# Patient Record
Sex: Male | Born: 1960 | Race: White | Hispanic: No | Marital: Single | Smoking: Former smoker
Health system: Southern US, Community
[De-identification: ages and names within clinical notes are randomized; demographics above are authoritative.]

## PROBLEM LIST (undated history)

## (undated) DIAGNOSIS — K219 Gastro-esophageal reflux disease without esophagitis: Secondary | ICD-10-CM

## (undated) DIAGNOSIS — Q161 Congenital absence, atresia and stricture of auditory canal (external): Secondary | ICD-10-CM

## (undated) DIAGNOSIS — Z950 Presence of cardiac pacemaker: Secondary | ICD-10-CM

## (undated) DIAGNOSIS — E43 Unspecified severe protein-calorie malnutrition: Secondary | ICD-10-CM

## (undated) DIAGNOSIS — G8250 Quadriplegia, unspecified: Secondary | ICD-10-CM

## (undated) DIAGNOSIS — F339 Major depressive disorder, recurrent, unspecified: Secondary | ICD-10-CM

## (undated) HISTORY — PX: SUPRAPUBIC CATHETER INSERTION: SUR719

## (undated) HISTORY — PX: PEG TUBE PLACEMENT: SUR1034

---

## 2017-01-18 DIAGNOSIS — S14109A Unspecified injury at unspecified level of cervical spinal cord, initial encounter: Secondary | ICD-10-CM | POA: Insufficient documentation

## 2017-01-19 DIAGNOSIS — S301XXA Contusion of abdominal wall, initial encounter: Secondary | ICD-10-CM | POA: Insufficient documentation

## 2017-01-19 DIAGNOSIS — S2241XA Multiple fractures of ribs, right side, initial encounter for closed fracture: Secondary | ICD-10-CM | POA: Insufficient documentation

## 2017-01-20 DIAGNOSIS — R627 Adult failure to thrive: Secondary | ICD-10-CM | POA: Insufficient documentation

## 2017-01-20 DIAGNOSIS — T794XXA Traumatic shock, initial encounter: Secondary | ICD-10-CM | POA: Insufficient documentation

## 2017-01-20 DIAGNOSIS — F438 Other reactions to severe stress: Secondary | ICD-10-CM | POA: Insufficient documentation

## 2017-02-19 DIAGNOSIS — E43 Unspecified severe protein-calorie malnutrition: Secondary | ICD-10-CM | POA: Insufficient documentation

## 2017-05-10 DIAGNOSIS — L89104 Pressure ulcer of unspecified part of back, stage 4: Secondary | ICD-10-CM | POA: Insufficient documentation

## 2017-07-03 ENCOUNTER — Encounter: Payer: Self-pay | Admitting: Internal Medicine

## 2017-07-03 ENCOUNTER — Emergency Department: Payer: Medicaid Other

## 2017-07-03 ENCOUNTER — Inpatient Hospital Stay
Admission: EM | Admit: 2017-07-03 | Discharge: 2017-07-08 | DRG: 871 | Disposition: A | Discharge status: Home / Self Care | Payer: Medicaid Other | Attending: Internal Medicine | Admitting: Internal Medicine

## 2017-07-03 DIAGNOSIS — D638 Anemia in other chronic diseases classified elsewhere: Secondary | ICD-10-CM | POA: Diagnosis present

## 2017-07-03 DIAGNOSIS — Z9359 Other cystostomy status: Secondary | ICD-10-CM

## 2017-07-03 DIAGNOSIS — Z87891 Personal history of nicotine dependence: Secondary | ICD-10-CM | POA: Diagnosis not present

## 2017-07-03 DIAGNOSIS — Z931 Gastrostomy status: Secondary | ICD-10-CM | POA: Diagnosis not present

## 2017-07-03 DIAGNOSIS — L8990 Pressure ulcer of unspecified site, unspecified stage: Secondary | ICD-10-CM | POA: Insufficient documentation

## 2017-07-03 DIAGNOSIS — Z66 Do not resuscitate: Secondary | ICD-10-CM | POA: Diagnosis present

## 2017-07-03 DIAGNOSIS — E43 Unspecified severe protein-calorie malnutrition: Secondary | ICD-10-CM | POA: Diagnosis present

## 2017-07-03 DIAGNOSIS — A419 Sepsis, unspecified organism: Principal | ICD-10-CM

## 2017-07-03 DIAGNOSIS — N319 Neuromuscular dysfunction of bladder, unspecified: Secondary | ICD-10-CM | POA: Diagnosis present

## 2017-07-03 DIAGNOSIS — R0902 Hypoxemia: Secondary | ICD-10-CM | POA: Diagnosis present

## 2017-07-03 DIAGNOSIS — Y95 Nosocomial condition: Secondary | ICD-10-CM | POA: Diagnosis present

## 2017-07-03 DIAGNOSIS — L89154 Pressure ulcer of sacral region, stage 4: Secondary | ICD-10-CM | POA: Diagnosis present

## 2017-07-03 DIAGNOSIS — Z880 Allergy status to penicillin: Secondary | ICD-10-CM

## 2017-07-03 DIAGNOSIS — R532 Functional quadriplegia: Secondary | ICD-10-CM | POA: Diagnosis present

## 2017-07-03 DIAGNOSIS — T83511A Infection and inflammatory reaction due to indwelling urethral catheter, initial encounter: Secondary | ICD-10-CM

## 2017-07-03 DIAGNOSIS — Z950 Presence of cardiac pacemaker: Secondary | ICD-10-CM | POA: Diagnosis not present

## 2017-07-03 DIAGNOSIS — K219 Gastro-esophageal reflux disease without esophagitis: Secondary | ICD-10-CM | POA: Diagnosis present

## 2017-07-03 DIAGNOSIS — Z79899 Other long term (current) drug therapy: Secondary | ICD-10-CM

## 2017-07-03 DIAGNOSIS — Z888 Allergy status to other drugs, medicaments and biological substances status: Secondary | ICD-10-CM | POA: Diagnosis not present

## 2017-07-03 DIAGNOSIS — J90 Pleural effusion, not elsewhere classified: Secondary | ICD-10-CM

## 2017-07-03 DIAGNOSIS — R001 Bradycardia, unspecified: Secondary | ICD-10-CM | POA: Diagnosis present

## 2017-07-03 DIAGNOSIS — J189 Pneumonia, unspecified organism: Secondary | ICD-10-CM

## 2017-07-03 DIAGNOSIS — N390 Urinary tract infection, site not specified: Secondary | ICD-10-CM

## 2017-07-03 DIAGNOSIS — Z8249 Family history of ischemic heart disease and other diseases of the circulatory system: Secondary | ICD-10-CM | POA: Diagnosis not present

## 2017-07-03 DIAGNOSIS — R319 Hematuria, unspecified: Secondary | ICD-10-CM

## 2017-07-03 HISTORY — DX: Unspecified severe protein-calorie malnutrition: E43

## 2017-07-03 HISTORY — DX: Presence of cardiac pacemaker: Z95.0

## 2017-07-03 HISTORY — DX: Congenital absence, atresia and stricture of auditory canal (external): Q16.1

## 2017-07-03 HISTORY — DX: Major depressive disorder, recurrent, unspecified: F33.9

## 2017-07-03 HISTORY — DX: Gastro-esophageal reflux disease without esophagitis: K21.9

## 2017-07-03 HISTORY — DX: Quadriplegia, unspecified: G82.50

## 2017-07-03 LAB — URINALYSIS, ROUTINE W REFLEX MICROSCOPIC
BACTERIA UA: NONE SEEN
Bilirubin Urine: NEGATIVE
Glucose, UA: NEGATIVE mg/dL
KETONES UR: NEGATIVE mg/dL
Nitrite: NEGATIVE
PROTEIN: 30 mg/dL — AB
Specific Gravity, Urine: 1.015 (ref 1.005–1.030)
pH: 5 (ref 5.0–8.0)

## 2017-07-03 LAB — COMPREHENSIVE METABOLIC PANEL
ALBUMIN: 1.6 g/dL — AB (ref 3.5–5.0)
ALK PHOS: 115 U/L (ref 38–126)
ALT: 21 U/L (ref 17–63)
AST: 35 U/L (ref 15–41)
Anion gap: 8 (ref 5–15)
BILIRUBIN TOTAL: 0.3 mg/dL (ref 0.3–1.2)
BUN: 58 mg/dL — AB (ref 6–20)
CO2: 29 mmol/L (ref 22–32)
Calcium: 9 mg/dL (ref 8.9–10.3)
Chloride: 96 mmol/L — ABNORMAL LOW (ref 101–111)
Creatinine, Ser: 0.69 mg/dL (ref 0.61–1.24)
GFR calc Af Amer: >60 mL/min (ref >60)
GFR calc non Af Amer: >60 mL/min (ref >60)
GLUCOSE: 127 mg/dL — AB (ref 65–99)
POTASSIUM: 3.9 mmol/L (ref 3.5–5.1)
Sodium: 133 mmol/L — ABNORMAL LOW (ref 135–145)
Total Protein: 6.3 g/dL — ABNORMAL LOW (ref 6.5–8.1)

## 2017-07-03 LAB — CBC WITH DIFFERENTIAL/PLATELET
BASOS ABS: 0.1 10*3/uL (ref 0–0.1)
Basophils Relative: 0 %
EOS PCT: 1 %
Eosinophils Absolute: 0.2 10*3/uL (ref 0–0.7)
HCT: 28.1 % — ABNORMAL LOW (ref 40.0–52.0)
Hemoglobin: 9 g/dL — ABNORMAL LOW (ref 13.0–18.0)
LYMPHS ABS: 0.5 10*3/uL — AB (ref 1.0–3.6)
LYMPHS PCT: 2 %
MCH: 26.7 pg (ref 26.0–34.0)
MCHC: 32 g/dL (ref 32.0–36.0)
MCV: 83.3 fL (ref 80.0–100.0)
MONO ABS: 1.4 10*3/uL — AB (ref 0.2–1.0)
MONOS PCT: 5 %
Neutro Abs: 25.1 10*3/uL — ABNORMAL HIGH (ref 1.4–6.5)
Neutrophils Relative %: 92 %
PLATELETS: 967 10*3/uL — AB (ref 150–440)
RBC: 3.38 MIL/uL — ABNORMAL LOW (ref 4.40–5.90)
RDW: 18.3 % — AB (ref 11.5–14.5)
WBC: 27.3 10*3/uL — ABNORMAL HIGH (ref 3.8–10.6)

## 2017-07-03 LAB — TROPONIN I

## 2017-07-03 LAB — MRSA PCR SCREENING: MRSA BY PCR: POSITIVE — AB

## 2017-07-03 LAB — LACTIC ACID, PLASMA
LACTIC ACID, VENOUS: 2 mmol/L — AB (ref 0.5–1.9)
Lactic Acid, Venous: 1.9 mmol/L (ref 0.5–1.9)

## 2017-07-03 LAB — APTT: aPTT: 44 seconds — ABNORMAL HIGH (ref 24–36)

## 2017-07-03 LAB — BRAIN NATRIURETIC PEPTIDE: B Natriuretic Peptide: 56 pg/mL (ref 0.0–100.0)

## 2017-07-03 LAB — PROTIME-INR
INR: 1.27
PROTHROMBIN TIME: 16 s — AB (ref 11.4–15.2)

## 2017-07-03 MED ORDER — SODIUM CHLORIDE 0.9 % IV BOLUS (SEPSIS)
1000.0000 mL | Freq: Once | INTRAVENOUS | Status: AC
  Administered 2017-07-03: 1000 mL via INTRAVENOUS

## 2017-07-03 MED ORDER — HYDROCORTISONE ACETATE 25 MG RE SUPP
25.0000 mg | Freq: Two times a day (BID) | RECTAL | Status: DC
  Filled 2017-07-03 (×9): qty 1

## 2017-07-03 MED ORDER — HYDROCODONE-ACETAMINOPHEN 5-325 MG PO TABS: 1.0000 | ORAL_TABLET | ORAL | Status: DC | PRN

## 2017-07-03 MED ORDER — MAGNESIUM HYDROXIDE 400 MG/5ML PO SUSP
30.0000 mL | Freq: Two times a day (BID) | ORAL | Status: DC | PRN
  Filled 2017-07-03: qty 30

## 2017-07-03 MED ORDER — ADULT MULTIVITAMIN LIQUID CH
15.0000 mL | Freq: Every day | ORAL | Status: DC
  Administered 2017-07-04 – 2017-07-08 (×5): 15 mL
  Filled 2017-07-03 (×5): qty 15

## 2017-07-03 MED ORDER — ACETAMINOPHEN 325 MG PO TABS: 650.0000 mg | ORAL_TABLET | Freq: Four times a day (QID) | ORAL | Status: DC | PRN

## 2017-07-03 MED ORDER — ALPRAZOLAM 0.5 MG PO TABS
0.5000 mg | ORAL_TABLET | Freq: Every day | ORAL | Status: DC
  Administered 2017-07-03 – 2017-07-08 (×5): 0.5 mg
  Filled 2017-07-03 (×5): qty 1

## 2017-07-03 MED ORDER — VANCOMYCIN HCL IN DEXTROSE 1-5 GM/200ML-% IV SOLN
1000.0000 mg | Freq: Once | INTRAVENOUS | Status: AC
  Administered 2017-07-03: 1000 mg via INTRAVENOUS
  Filled 2017-07-03: qty 200

## 2017-07-03 MED ORDER — PANTOPRAZOLE SODIUM 40 MG PO PACK
40.0000 mg | PACK | Freq: Every day | ORAL | Status: DC
  Administered 2017-07-04 – 2017-07-08 (×5): 40 mg
  Filled 2017-07-03 (×5): qty 20

## 2017-07-03 MED ORDER — VANCOMYCIN HCL IN DEXTROSE 1-5 GM/200ML-% IV SOLN
1000.0000 mg | Freq: Two times a day (BID) | INTRAVENOUS | Status: DC
  Administered 2017-07-04 – 2017-07-05 (×3): 1000 mg via INTRAVENOUS
  Filled 2017-07-03 (×4): qty 200

## 2017-07-03 MED ORDER — JEVITY 1.5 CAL/FIBER PO LIQD: 1000.0000 mL | Freq: Every day | ORAL | Status: DC

## 2017-07-03 MED ORDER — MUPIROCIN 2 % EX OINT
1.0000 "application " | TOPICAL_OINTMENT | Freq: Two times a day (BID) | CUTANEOUS | Status: AC
  Administered 2017-07-04 – 2017-07-08 (×10): 1 via NASAL
  Filled 2017-07-03: qty 22

## 2017-07-03 MED ORDER — JEVITY 1.5 CAL/FIBER PO LIQD
237.0000 mL | Freq: Every day | ORAL | Status: DC
  Administered 2017-07-03 – 2017-07-08 (×23): 237 mL

## 2017-07-03 MED ORDER — HYDROCODONE-ACETAMINOPHEN 5-325 MG PO TABS
1.0000 | ORAL_TABLET | ORAL | Status: DC | PRN
  Administered 2017-07-03 – 2017-07-06 (×13): 2
  Administered 2017-07-07: 11:00:00 1
  Administered 2017-07-07 – 2017-07-08 (×3): 2
  Filled 2017-07-03 (×11): qty 2
  Filled 2017-07-03: qty 1
  Filled 2017-07-03 (×5): qty 2

## 2017-07-03 MED ORDER — DEXTROSE 5 % IV SOLN
2.0000 g | Freq: Once | INTRAVENOUS | Status: AC
  Administered 2017-07-03: 2 g via INTRAVENOUS
  Filled 2017-07-03: qty 2

## 2017-07-03 MED ORDER — DOCUSATE SODIUM 100 MG PO CAPS: 100.0000 mg | ORAL_CAPSULE | Freq: Two times a day (BID) | ORAL | Status: DC | PRN

## 2017-07-03 MED ORDER — CHLORHEXIDINE GLUCONATE CLOTH 2 % EX PADS
6.0000 | MEDICATED_PAD | Freq: Every day | CUTANEOUS | Status: DC
  Administered 2017-07-04 – 2017-07-07 (×4): 6 via TOPICAL

## 2017-07-03 MED ORDER — FLUOXETINE HCL 20 MG PO TABS
20.0000 mg | ORAL_TABLET | Freq: Every day | ORAL | Status: DC
  Administered 2017-07-04 – 2017-07-08 (×5): 20 mg
  Filled 2017-07-03 (×5): qty 1

## 2017-07-03 MED ORDER — IPRATROPIUM-ALBUTEROL 0.5-2.5 (3) MG/3ML IN SOLN
3.0000 mL | Freq: Four times a day (QID) | RESPIRATORY_TRACT | Status: DC | PRN
  Administered 2017-07-06 – 2017-07-07 (×4): 3 mL via RESPIRATORY_TRACT
  Filled 2017-07-03 (×6): qty 3

## 2017-07-03 MED ORDER — IPRATROPIUM-ALBUTEROL 0.5-2.5 (3) MG/3ML IN SOLN
3.0000 mL | Freq: Once | RESPIRATORY_TRACT | Status: AC
  Administered 2017-07-03: 3 mL via RESPIRATORY_TRACT
  Filled 2017-07-03: qty 3

## 2017-07-03 MED ORDER — DEXTROSE 5 % IV SOLN
2.0000 g | Freq: Three times a day (TID) | INTRAVENOUS | Status: DC
  Administered 2017-07-04 – 2017-07-08 (×13): 2 g via INTRAVENOUS
  Filled 2017-07-03 (×15): qty 2

## 2017-07-03 MED ORDER — DIPHENHYDRAMINE HCL 25 MG PO TABS
25.0000 mg | ORAL_TABLET | Freq: Four times a day (QID) | ORAL | Status: DC | PRN
  Administered 2017-07-04 – 2017-07-07 (×12): 25 mg via ORAL
  Filled 2017-07-03 (×17): qty 1

## 2017-07-03 MED ORDER — LEVOFLOXACIN IN D5W 750 MG/150ML IV SOLN
750.0000 mg | Freq: Once | INTRAVENOUS | Status: AC
  Administered 2017-07-03: 750 mg via INTRAVENOUS
  Filled 2017-07-03: qty 150

## 2017-07-03 MED ORDER — DOXEPIN HCL 10 MG PO CAPS
10.0000 mg | ORAL_CAPSULE | Freq: Every day | ORAL | Status: DC
  Administered 2017-07-03 – 2017-07-08 (×5): 10 mg
  Filled 2017-07-03 (×6): qty 1

## 2017-07-03 MED ORDER — PRO-STAT SUGAR FREE PO LIQD
30.0000 mL | Freq: Three times a day (TID) | ORAL | Status: DC
Start: 2017-07-03 — End: 2017-07-07
  Administered 2017-07-04 – 2017-07-07 (×10): 30 mL via ORAL

## 2017-07-03 MED ORDER — SENNOSIDES 8.8 MG/5ML PO SYRP
10.0000 mL | ORAL_SOLUTION | Freq: Every day | ORAL | Status: DC
  Administered 2017-07-05: 10 mL
  Filled 2017-07-03 (×6): qty 10

## 2017-07-03 MED ORDER — ENOXAPARIN SODIUM 40 MG/0.4ML ~~LOC~~ SOLN: 40.0000 mg | SUBCUTANEOUS | Status: DC

## 2017-07-03 MED ORDER — MELATONIN 3 MG PO TABS
1.0000 | ORAL_TABLET | Freq: Every day | ORAL | Status: DC
Start: 2017-07-03 — End: 2017-07-04
  Filled 2017-07-03: qty 1

## 2017-07-03 MED ORDER — ENOXAPARIN SODIUM 40 MG/0.4ML ~~LOC~~ SOLN
40.0000 mg | SUBCUTANEOUS | Status: DC
  Administered 2017-07-04 – 2017-07-08 (×5): 40 mg via SUBCUTANEOUS
  Filled 2017-07-03 (×5): qty 0.4

## 2017-07-03 NOTE — Progress Notes (Signed)
Dr. Elisabeth PigeonVachhani notified patient normally takes Norco 2 tabs for pain at SNF. Verbal order read back and verified for Norco 1-2 tablets.
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
## 2017-07-03 NOTE — ED Provider Notes (Signed)
[facility] Emergency Department Provider Note  ____________________________________________  Time seen: Approximately 11:11 AM  I have reviewed the triage vital signs and the nursing notes.   HISTORY  Chief Complaint Shortness of Breath    HPI Alexander Escobar is a 56 y.o. male with a history of quadriplegia, indwelling Foley catheter, currently living in a nursing facility while under the treatment for pneumonia with Levaquin, presenting for shortness of breath and wheezing. The patient reports that this morning, he felt more short of breath and he has over the past few days. On arrival to the emergency department, the patient has oxygen saturations in the low 90s with tachypnea and accessory muscle use, with significant tachycardia or the patient is also concerned he may be dehydrated and does have dry mucous membranes. He is mentating well on arrival.   No past medical history on file.  There are no active problems to display for this patient.   No past surgical history on file.    Allergies Ciprofloxacin and Penicillins  No family history on file.  Social History Social History  Substance Use Topics  . Smoking status: Not on file  . Smokeless tobacco: Not on file  . Alcohol use Not on file    Review of Systems Constitutional: No fever/chills.No lightheadedness or syncope. Eyes: No visual changes. ENT: No sore throat. No congestion or rhinorrhea. Cardiovascular: Denies chest pain. Denies palpitations. Respiratory: Positive shortness of breath and wheezing.  Positive cough. Gastrointestinal: No abdominal pain.  No nausea, no vomiting.  No diarrhea.  No constipation. Genitourinary: Positive indwelling Foley with urine that has significant sediment. Musculoskeletal: "My back is messed up" Skin: Negative for rash. Neurological: Negative for headaches. No focal numbness, tingling or weakness.      ____________________________________________   PHYSICAL EXAM:  VITAL SIGNS: ED Triage Vitals  Enc Vitals Group     BP --      Pulse Rate 07/03/17 1108 (!) 115     Resp 07/03/17 1108 (!) 35     Temp 07/03/17 1108 98.4 F (36.9 C)     Temp Source 07/03/17 1108 Oral     SpO2 07/03/17 1108 92 %     Weight 07/03/17 1109 130 lb (59 kg)     Height 07/03/17 1109 5\' 9"  (1.753 m)     Head Circumference --      Peak Flow --      Pain Score 07/03/17 1106 0     Pain Loc --      Pain Edu? --      Excl. in GC? --     Constitutional: Alert and oriented. Chronically ill appearing and toxic. Answers questions appropriately.  Mentating normally. Eyes: Conjunctivae are normal.  EOMI. No scleral icterus. Head: Atraumatic. Nose: No congestion/rhinnorhea. Mouth/Throat: Mucous membranes are dry with diffuse poor dentition.  Neck: No stridor.  Supple.  No JVD. No meningismus. Cardiovascular: Fast rate, regular rhythm. No murmurs, rubs or gallops.  Respiratory: Positive tachypnea with accessory muscle use and retractions. The patient is oxygenating in the low 90s on my examination on supplemental oxygen. He is able to speak in 4-5 word sentences. He has rales diffusely bilaterally with some associated expiratory wheezing. Gastrointestinal: Soft, nontender and nondistended.  No guarding or rebound.  No peritoneal signs. GU: Doing Foley catheter with thick urine with lots of sediment.  Musculoskeletal: Contracted musculature diffusely. Neurologic:  A&Ox3.  Speech is clear.  Face and smile are symmetric.  EOMI.  paralyzed Skin:  Skin  is warm, dry. Decubitus evaluation deferred at this time until patient stabilized and can be rolled. Psychiatric: Mood and affect are normal. Speech and behavior are normal.  Normal judgement.  ____________________________________________   LABS (all labs ordered are listed, but only abnormal results are displayed)  Labs Reviewed  CULTURE, BLOOD (ROUTINE X 2)   CULTURE, BLOOD (ROUTINE X 2)  CULTURE, EXPECTORATED SPUTUM-ASSESSMENT  URINE CULTURE  COMPREHENSIVE METABOLIC PANEL  CBC WITH DIFFERENTIAL/PLATELET  URINALYSIS, ROUTINE W REFLEX MICROSCOPIC  LACTIC ACID, PLASMA  LACTIC ACID, PLASMA  BRAIN NATRIURETIC PEPTIDE  TROPONIN I  BLOOD GAS, VENOUS  APTT  PROTIME-INR   ____________________________________________  EKG  ED ECG REPORT I, Rockne Menghini, the attending physician, personally viewed and interpreted this ECG.   Date: 07/03/2017  EKG Time: 1109  Rate: 115  Rhythm: sinus tachycardia  Axis: leftward  Intervals:none  ST&T Change: No STEMI   ____________________________________________  RADIOLOGY  No results found.  ____________________________________________   PROCEDURES  Procedure(s) performed: None  Procedures  Critical Care performed: Yes, see critical care note(s) ____________________________________________   INITIAL IMPRESSION / ASSESSMENT AND PLAN / ED COURSE  Pertinent labs & imaging results that were available during my care of the patient were reviewed by me and considered in my medical decision making (see chart for details).  56 y.o. male with paralysis, indwelling Foley, ongoing treatment for pneumonia presenting with shortness of breath. On my evaluation, the patient has signs and symptoms consistent with sepsis. There are multiple sources including urine, pulmonary, and blood. A code sepsis has been called and the patient will be given immediate antibiotics. Plan admission to the hospital.  ----------------------------------------- 12:00 PM on 07/03/2017 -----------------------------------------  The patient does have a chest x-ray which shows increasing opacities in the bilateral lungs and pleural effusion; this is likely due to infection given his symptoms and white blood cell count of 27.3. Pulmonary edema or some superimposed pulmonary edema is not excluded. At this time, the  patient will be admitted to the hospitalist service.  CRITICAL CARE Performed by: Rockne Menghini   Total critical care time: 45 minutes  Critical care time was exclusive of separately billable procedures and treating other patients.  Critical care was necessary to treat or prevent imminent or life-threatening deterioration.  Critical care was time spent personally by me on the following activities: development of treatment plan with patient and/or surrogate as well as nursing, discussions with consultants, evaluation of patient's response to treatment, examination of patient, obtaining history from patient or surrogate, ordering and performing treatments and interventions, ordering and review of laboratory studies, ordering and review of radiographic studies, pulse oximetry and re-evaluation of patient's condition.   ____________________________________________  FINAL CLINICAL IMPRESSION(S) / ED DIAGNOSES  Final diagnoses:  Sepsis, due to unspecified organism [facility])  Urinary tract infection with hematuria, site unspecified  HCAP (healthcare-associated pneumonia)         NEW MEDICATIONS STARTED DURING THIS VISIT:  New Prescriptions   No medications on file      Rockne Menghini, MD 07/03/17 1201

## 2017-07-03 NOTE — ED Triage Notes (Signed)
[facility] Reports having difficulty breathing and cough for  Several days, worsening today. Patient on 4L [place] upon arrival to ED with O2 saturation of 94%. Patient states he does not wear oxygen at baseline. Patient also complaining of dark urine and "back is messed up". Patient A&O x4.

## 2017-07-03 NOTE — Progress Notes (Addendum)
Patient does bolus feedings 5 times daily, Jevity 1.5 and Prostat 3 times daily. Dr. Elisabeth PigeonVachhani notified.

## 2017-07-03 NOTE — Consult Note (Signed)
Pharmacy Antibiotic Note  Alexander CofferJames Escobar is a 56 y.o. male admitted on 07/03/2017 with pneumonia and UTI.  Pharmacy has been consulted for cefepime and vancomycin dosing. Patient failed outpatient treatment with levofloxacin. Pt has PCN allergy-however reaction is rash. Per Dr. Yvetta CoderVachhani-ok to try cefepime  Plan: Vancomycin 1g was given in the ED. Will give next dose is 6 hours for stacked dosing Vancomycin 1000mg  IV every 12 hours.  Goal trough 15-20 mcg/mL.  Vanc trough prior to the 5th total dose 7/19 @ 0630 Cefepime 2g q 8 hours  Height: 5\' 9"  (175.3 cm) Weight: 130 lb (59 kg) IBW/kg (Calculated) : 70.7  Temp (24hrs), Avg:98.4 F (36.9 C), Min:98.4 F (36.9 C), Max:98.4 F (36.9 C)   Recent Labs Lab 07/03/17 1113  WBC 27.3*  CREATININE 0.69  LATICACIDVEN 2.0*    Estimated Creatinine Clearance: 88.1 mL/min (by C-G formula based on SCr of 0.69 mg/dL).    Allergies  Allergen Reactions  . Ciprofloxacin Swelling  . Penicillins Rash    Antimicrobials this admission: levofloxacin 7/17 >> one dose aztreonam 7/17 >> one dose Vancomycin 7/17>> Cefepime 7/17>>  Dose adjustments this admission:   Microbiology results: 7/17 BCx:  7/17 UCx:   7/17 MRSA PCR:   Thank you for allowing pharmacy to be a part of this patient's care.  Olene FlossMelissa D Maribeth Jiles, Pharm.D, BCPS Clinical Pharmacist  07/03/2017 2:58 PM

## 2017-07-04 DIAGNOSIS — L8990 Pressure ulcer of unspecified site, unspecified stage: Secondary | ICD-10-CM | POA: Insufficient documentation

## 2017-07-04 LAB — BASIC METABOLIC PANEL
Anion gap: 8 (ref 5–15)
BUN: 47 mg/dL — ABNORMAL HIGH (ref 6–20)
CALCIUM: 8.4 mg/dL — AB (ref 8.9–10.3)
CO2: 26 mmol/L (ref 22–32)
CREATININE: 0.65 mg/dL (ref 0.61–1.24)
Chloride: 103 mmol/L (ref 101–111)
Glucose, Bld: 126 mg/dL — ABNORMAL HIGH (ref 65–99)
Potassium: 3.5 mmol/L (ref 3.5–5.1)
SODIUM: 137 mmol/L (ref 135–145)

## 2017-07-04 LAB — CBC
HCT: 22.6 % — ABNORMAL LOW (ref 40.0–52.0)
Hemoglobin: 7.2 g/dL — ABNORMAL LOW (ref 13.0–18.0)
MCH: 26.9 pg (ref 26.0–34.0)
MCHC: 32 g/dL (ref 32.0–36.0)
MCV: 83.9 fL (ref 80.0–100.0)
PLATELETS: 870 10*3/uL — AB (ref 150–440)
RBC: 2.69 MIL/uL — AB (ref 4.40–5.90)
RDW: 18.3 % — ABNORMAL HIGH (ref 11.5–14.5)
WBC: 35.1 10*3/uL — ABNORMAL HIGH (ref 3.8–10.6)

## 2017-07-04 LAB — EXPECTORATED SPUTUM ASSESSMENT W GRAM STAIN, RFLX TO RESP C: Special Requests: NORMAL

## 2017-07-04 LAB — HIV ANTIBODY (ROUTINE TESTING W REFLEX): HIV SCREEN 4TH GENERATION: NONREACTIVE

## 2017-07-04 LAB — EXPECTORATED SPUTUM ASSESSMENT W REFEX TO RESP CULTURE

## 2017-07-04 MED ORDER — GUAIFENESIN 100 MG/5ML PO SOLN
20.0000 mL | ORAL | Status: DC | PRN
  Administered 2017-07-04: 400 mg via ORAL
  Filled 2017-07-04 (×2): qty 20

## 2017-07-04 MED ORDER — ONDANSETRON 4 MG PO TBDP
4.0000 mg | ORAL_TABLET | Freq: Three times a day (TID) | ORAL | Status: DC | PRN
  Administered 2017-07-05 – 2017-07-08 (×6): 4 mg via ORAL
  Filled 2017-07-04 (×7): qty 1

## 2017-07-04 MED ORDER — LIDOCAINE 4 % EX PTCH: 3.0000 | MEDICATED_PATCH | Freq: Two times a day (BID) | CUTANEOUS | Status: DC

## 2017-07-04 MED ORDER — FUROSEMIDE 20 MG PO TABS
20.0000 mg | ORAL_TABLET | Freq: Every day | ORAL | Status: DC
  Administered 2017-07-04 – 2017-07-07 (×4): 20 mg via ORAL
  Filled 2017-07-04 (×5): qty 1

## 2017-07-04 MED ORDER — MELATONIN 5 MG PO TABS
5.0000 mg | ORAL_TABLET | Freq: Every day | ORAL | Status: DC
Start: 2017-07-04 — End: 2017-07-07
  Administered 2017-07-04 – 2017-07-06 (×4): 5 mg via ORAL
  Filled 2017-07-04 (×4): qty 1

## 2017-07-04 MED ORDER — FREE WATER
120.0000 mL | Freq: Every day | Status: DC
  Administered 2017-07-04 – 2017-07-08 (×19): 120 mL

## 2017-07-04 MED ORDER — DOCUSATE SODIUM 50 MG/5ML PO LIQD
200.0000 mg | Freq: Every day | ORAL | Status: DC
  Administered 2017-07-05: 200 mg
  Filled 2017-07-04 (×5): qty 20

## 2017-07-04 MED ORDER — MELATONIN 5 MG PO TABS
1.0000 | ORAL_TABLET | Freq: Every day | ORAL | Status: DC
  Filled 2017-07-04 (×4): qty 1

## 2017-07-04 MED ORDER — OSTEO BI-FLEX ADV JOINT SHIELD PO TABS: ORAL_TABLET | Freq: Every day | ORAL | Status: DC

## 2017-07-04 MED ORDER — BISACODYL 10 MG RE SUPP
10.0000 mg | Freq: Every day | RECTAL | Status: DC
  Filled 2017-07-04: qty 1

## 2017-07-04 MED ORDER — LIDOCAINE 5 % EX PTCH
1.0000 | MEDICATED_PATCH | CUTANEOUS | Status: DC
  Filled 2017-07-04 (×4): qty 1

## 2017-07-04 MED ORDER — FREE WATER
200.0000 mL | Freq: Four times a day (QID) | Status: DC
  Administered 2017-07-04 – 2017-07-07 (×14): 200 mL

## 2017-07-04 MED ORDER — POLYETHYLENE GLYCOL 3350 17 G PO PACK
17.0000 g | PACK | Freq: Every day | ORAL | Status: DC
  Administered 2017-07-05 – 2017-07-07 (×2): 17 g via ORAL
  Filled 2017-07-04 (×2): qty 1

## 2017-07-04 MED ORDER — MELATONIN 3 MG PO TABS: 1.0000 | ORAL_TABLET | Freq: Every day | ORAL | Status: DC

## 2017-07-04 NOTE — Progress Notes (Signed)
SLP Cancellation Note  Patient Details Name: Alexander Escobar MRN: [id number] DOB: 03/15/1961   Cancelled treatment:       Reason Eval/Treat Not Completed: Patient not medically ready (pt has a baseline of Dysphagia; PEG placement)  Reviewed chart notes; consulted family member re: pt's swallowing status. It was stated pt received a swallow study "before he left [facility]" and that he "could drink thin liquids". Unsure how he has done w/ that poc since admitting to the SNF in April. Pt does continue w/ PEG TFs per report.  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 Due to his declined medical and respiratory status', recommend continue no oral intake w/ PEG TFs; frequent oral care; f/u w/ MBSS tomorrow for objective assessment of swallowing. Family and NSG updated. MD agreed to order.   Jerilynn SomKatherine Watson, MS, CCC-SLP Watson,Katherine 07/04/2017, 5:15 PM
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
## 2017-07-04 NOTE — Progress Notes (Signed)
Please note patient is currently receiving Palliative services through [facility]. CSW Dede QuerySarah McNulty made aware. Thank you. Dayna BarkerKaren Robertson RN, BSN, [facility], [facility] Liaison [phone] c

## 2017-07-04 NOTE — Plan of Care (Signed)
Problem: Education: Goal: Knowledge of [facility] Education information/materials will improve Outcome: Progressing VS WDL, free of falls.  Reports wound pain 7/10, improved w/ Norco 10-650mg  per tube x1.  Wound dsgs changed during shift.  Pt transferred to specialty air mattress.  No other needs overnight.  Bed in low position, call bell within reach.  WCTM.

## 2017-07-04 NOTE — Consult Note (Signed)
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  Stage 4 sacral pressure injury Present on admission.  Right lateral back with hematoma evacuation site, full thickness injury. Wound type:Full thickness injury, stage 4 pressure injuries.  Pressure Injury POA: Yes Measurement: Sacrum:  8 cm x 6 cm x 4 cm, bone palpable Right scapula:  3 cm x 3.5 cm x 1.5 cm with undermining present circumferentially, extends 1 cm .  Pale pink nongranulating  Right lateral back 4 cm x 2.6 cm x 0.5 cm pale pink nongranulating Midthoracic spine:  2.4 cm x 2 cm x 1 cm bonepalpable  Wound bed: pale pink nongranulating Drainage (amount, consistency, odor) Moderate serosanguinous  Musty odor.  Periwound:intact Dressing procedure/placement/frequency:Patient is placed on mattress with low air loss feature for pressure redistribution. Caregiver brought in positioning wedge from SNF.  [facility] and caregiver states that the MD Only wants NS moist gauze to wounds.   Cleanse wounds to back (3 openings) with NS and pat gently dry.  Fill each wound with NS moist gauze or kerlix, gently fill  in undermining.  Cover with ABD pad and tape.  Change daily and PRN soilage.  DO not use silicone border foam dressings, per MD and caregiver.  Will not follow at this time.  Please re-consult if needed.  Maple HudsonKaren Odean Fester RN BSN CWON Pager [phone]

## 2017-07-05 ENCOUNTER — Inpatient Hospital Stay: Payer: Medicaid Other

## 2017-07-05 LAB — VANCOMYCIN, TROUGH: Vancomycin Tr: 37 ug/mL (ref 15–20)

## 2017-07-05 NOTE — Progress Notes (Signed)
Spoke with Dr. Tobi BastosPyreddy about pt suprapubic catheter leaking at entry site. Per MD, a urology consult will be placed and follow up with the next shift. Will continue to monitor.
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
## 2017-07-05 NOTE — Progress Notes (Signed)
[facility]

## 2017-07-05 NOTE — Consult Note (Signed)
Pharmacy Antibiotic Note  Alexander Escobar is a 56 y.o. male admitted on 07/03/2017 with pneumonia and UTI.  Pharmacy has been consulted for cefepime and vancomycin dosing. Patient failed outpatient treatment with levofloxacin.   Plan:  VT = 37 mcg/mL is supratherapeutic on vancomycin 1000 mg IV q12h. Will discontinue vancomycin doses for now. Patient is quadraplegic, making it more difficult to assess renal function based on SCr.   Will hold additional vancomycin doses and check a random level at 0500 tomorrow (~24 hours after last administered dose). Resume vancomycin dosing once level is <15-20 mcg/mL  Continue cefepime 2g IV q 8 hours  Height: 5\' 9"  (175.3 cm) Weight: 130 lb (59 kg) IBW/kg (Calculated) : 70.7  Temp (24hrs), Avg:98.3 F (36.8 C), Min:97.8 F (36.6 C), Max:98.8 F (37.1 C)   Recent Labs Lab 07/03/17 1113 07/03/17 1653 07/04/17 0419 07/05/17 1359  WBC 27.3*  --  35.1*  --   CREATININE 0.69  --  0.65  --   LATICACIDVEN 2.0* 1.9  --   --   VANCOTROUGH  --   --   --  37*    Estimated Creatinine Clearance: 88.1 mL/min (by C-G formula based on SCr of 0.65 mg/dL).    Allergies  Allergen Reactions  . Ciprofloxacin Swelling  . Penicillins Rash    Antimicrobials this admission: levofloxacin 7/17 >> one dose aztreonam 7/17 >> one dose Vancomycin 7/17>> Cefepime 7/17>>  Dose adjustments this admission:   Microbiology results: 7/17 BCx: No growth 2 days 7/17 UCx:  >100K Staph aureus 7/17 MRSA PCR: Positive  Thank you for allowing pharmacy to be a part of this patient's care.  Cindi CarbonMary M Kennis Buell, Pharm.D, BCPS Clinical Pharmacist 07/05/2017 3:03 PM
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
## 2017-07-06 LAB — URINE CULTURE

## 2017-07-06 LAB — CBC
HCT: 25 % — ABNORMAL LOW (ref 40.0–52.0)
Hemoglobin: 8 g/dL — ABNORMAL LOW (ref 13.0–18.0)
MCH: 27.1 pg (ref 26.0–34.0)
MCHC: 32 g/dL (ref 32.0–36.0)
MCV: 84.7 fL (ref 80.0–100.0)
PLATELETS: 1021 10*3/uL — AB (ref 150–440)
RBC: 2.95 MIL/uL — AB (ref 4.40–5.90)
RDW: 18.1 % — AB (ref 11.5–14.5)
WBC: 25 10*3/uL — AB (ref 3.8–10.6)

## 2017-07-06 LAB — CREATININE, SERUM: Creatinine, Ser: 0.52 mg/dL — ABNORMAL LOW (ref 0.61–1.24)

## 2017-07-06 LAB — VANCOMYCIN, RANDOM: Vancomycin Rm: 26

## 2017-07-06 NOTE — Progress Notes (Signed)
Speech Therapy Note: reviewed chart notes; MD has followed up w/ pt and family re: MBSS results and understanding of the increased risk for aspiration w/ any oral intake. MD agreed w/ f/u w/ palliative care services for ongoing support. Education has been provided and posted in room. No further skilled ST services indicated at this time. ST will sign off w/ NSG to reconsult if any change in status warranting ST services occurs while admitted. Recommend frequent oral care for hygiene and oral stimulation.     Jerilynn SomKatherine Watson, MS, CCC-SLP
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
## 2017-07-06 NOTE — Consult Note (Signed)
Pharmacy Antibiotic Note  Alexander CofferJames Escobar is a 56 y.o. male admitted on 07/03/2017 with pneumonia and UTI.  Pharmacy has been consulted for cefepime and vancomycin dosing. Patient failed outpatient treatment with levofloxacin.   Plan:  VT = 37 mcg/mL is supratherapeutic on vancomycin 1000 mg IV q12h. Will discontinue vancomycin doses for now. Patient is quadraplegic, making it more difficult to assess renal function based on SCr.   Will hold additional vancomycin doses and check a random level at 0500 tomorrow (~24 hours after last administered dose). Resume vancomycin dosing once level is <15-20 mcg/mL  7/20 @ 0451 VR 26 supratherapeutic. Will continue to hold off on doses of vanc for now Patient's Ke based on two levels: 0.0235 T1/2 30 hours Time to get down to a level of 15 mcg/mL: 24 hours Will draw another vanc random 7/21 @ 0500 and will redose w/ vanc 15 mg/kg if level < 20 mcg/mL.  Continue cefepime 2g IV q 8 hours  Height: 5\' 9"  (175.3 cm) Weight: 130 lb (59 kg) IBW/kg (Calculated) : 70.7  Temp (24hrs), Avg:98.2 F (36.8 C), Min:97.8 F (36.6 C), Max:98.5 F (36.9 C)   Recent Labs Lab 07/03/17 1113 07/03/17 1653 07/04/17 0419 07/05/17 1359 07/06/17 0451  WBC 27.3*  --  35.1*  --  25.0*  CREATININE 0.69  --  0.65  --  0.52*  LATICACIDVEN 2.0* 1.9  --   --   --   VANCOTROUGH  --   --   --  37*  --   VANCORANDOM  --   --   --   --  26    Estimated Creatinine Clearance: 88.1 mL/min (A) (by C-G formula based on SCr of 0.52 mg/dL (L)).    Allergies  Allergen Reactions  . Ciprofloxacin Swelling  . Penicillins Rash    Antimicrobials this admission: levofloxacin 7/17 >> one dose aztreonam 7/17 >> one dose Vancomycin 7/17>> Cefepime 7/17>>  Dose adjustments this admission:   Microbiology results: 7/17 BCx: No growth 2 days 7/17 UCx:  >100K Staph aureus 7/17 MRSA PCR: Positive  Thank you for allowing pharmacy to be a part of this patient's care.  Thomasene Rippleavid   Nazim Kadlec, Pharm.D, BCPS Clinical Pharmacist 07/06/2017 5:50 AM

## 2017-07-06 NOTE — Progress Notes (Signed)
Assisted pt with Quad cough. Pt tolerated well.

## 2017-07-06 NOTE — Progress Notes (Signed)
Reported high platelet of 1021 to Dr. Tobi BastosPyreddy. No new orders, will continue to monitor.

## 2017-07-06 NOTE — Progress Notes (Signed)
Patient and family is aware of the risks of aspiration from oral intake for comfort, but want comfort intake orally

## 2017-07-07 LAB — CBC
HEMATOCRIT: 26.5 % — AB (ref 40.0–52.0)
HEMOGLOBIN: 8.7 g/dL — AB (ref 13.0–18.0)
MCH: 28 pg (ref 26.0–34.0)
MCHC: 32.9 g/dL (ref 32.0–36.0)
MCV: 84.9 fL (ref 80.0–100.0)
Platelets: 1075 10*3/uL (ref 150–440)
RBC: 3.12 MIL/uL — AB (ref 4.40–5.90)
RDW: 18.3 % — ABNORMAL HIGH (ref 11.5–14.5)
WBC: 21 10*3/uL — ABNORMAL HIGH (ref 3.8–10.6)

## 2017-07-07 LAB — VANCOMYCIN, RANDOM: Vancomycin Rm: 16

## 2017-07-07 MED ORDER — BISACODYL 10 MG RE SUPP: 10.0000 mg | Freq: Every day | RECTAL | Status: DC | PRN

## 2017-07-07 MED ORDER — HYDROCORTISONE ACETATE 25 MG RE SUPP
25.0000 mg | Freq: Two times a day (BID) | RECTAL | Status: DC | PRN
  Filled 2017-07-07: qty 1

## 2017-07-07 MED ORDER — DIPHENHYDRAMINE HCL 25 MG PO TABS
25.0000 mg | ORAL_TABLET | Freq: Four times a day (QID) | ORAL | Status: DC | PRN
  Administered 2017-07-08 (×2): 25 mg via ORAL
  Filled 2017-07-07 (×3): qty 1

## 2017-07-07 MED ORDER — ORAL CARE MOUTH RINSE
15.0000 mL | Freq: Two times a day (BID) | OROMUCOSAL | Status: DC
  Administered 2017-07-07 – 2017-07-08 (×2): 15 mL via OROMUCOSAL

## 2017-07-07 MED ORDER — HYDROCORTISONE ACETATE 25 MG RE SUPP: 25.0000 mg | Freq: Two times a day (BID) | RECTAL | Status: DC

## 2017-07-07 MED ORDER — MELATONIN 5 MG PO TABS
1.0000 | ORAL_TABLET | Freq: Every day | ORAL | Status: DC
  Administered 2017-07-08: 5 mg
  Filled 2017-07-07 (×2): qty 1

## 2017-07-07 MED ORDER — PRO-STAT SUGAR FREE PO LIQD
30.0000 mL | Freq: Three times a day (TID) | ORAL | Status: DC
  Administered 2017-07-07 – 2017-07-08 (×3): 30 mL

## 2017-07-07 MED ORDER — SODIUM CHLORIDE 0.9% FLUSH
3.0000 mL | Freq: Two times a day (BID) | INTRAVENOUS | Status: DC
  Administered 2017-07-07 – 2017-07-08 (×2): 3 mL via INTRAVENOUS

## 2017-07-07 MED ORDER — VANCOMYCIN HCL IN DEXTROSE 750-5 MG/150ML-% IV SOLN
750.0000 mg | Freq: Once | INTRAVENOUS | Status: AC
  Administered 2017-07-07: 11:00:00 750 mg via INTRAVENOUS
  Filled 2017-07-07: qty 150

## 2017-07-07 MED ORDER — GUAIFENESIN 100 MG/5ML PO SOLN
20.0000 mL | ORAL | Status: DC | PRN
  Filled 2017-07-07: qty 20

## 2017-07-07 MED ORDER — FUROSEMIDE 20 MG PO TABS
20.0000 mg | ORAL_TABLET | Freq: Every day | ORAL | Status: DC
  Administered 2017-07-08: 20 mg
  Filled 2017-07-07: qty 1

## 2017-07-07 MED ORDER — LIDOCAINE 5 % EX PTCH
1.0000 | MEDICATED_PATCH | Freq: Two times a day (BID) | CUTANEOUS | Status: DC | PRN
  Filled 2017-07-07: qty 1

## 2017-07-07 MED ORDER — SODIUM CHLORIDE 0.9% FLUSH
3.0000 mL | INTRAVENOUS | Status: DC | PRN
  Administered 2017-07-07 (×2): 3 mL via INTRAVENOUS
  Filled 2017-07-07 (×2): qty 3

## 2017-07-07 NOTE — Progress Notes (Signed)
Pt requests multiple meds; awakes and desires meds. Continued lung congestion; sputum to lab with respiratory intervention. Reluctant and co's soreness in arms/shoulders with turning/repositioning. Does not like wedge. Pain, nausea and allergy meds given as prn per pt request. Meds adjusted per pt request. IV antibiotics continued. Wound consult submitted.

## 2017-07-07 NOTE — Progress Notes (Signed)
DNR, allergy bracelet and yellow bracelet on left arm.

## 2017-07-07 NOTE — Progress Notes (Signed)
Quad cough assist following CPT and nebulizer treatment. Pt tolerated well

## 2017-07-07 NOTE — Clinical Social Work Note (Signed)
Clinical Social Work Assessment  Patient Details  Name: Alexander CofferJames Eisenhuth MRN: [id number] Date of Birth: 05/03/1961  Date of referral:  07/07/17               Reason for consult:  Facility Placement                Permission sought to share information with:  Facility Industrial/product designerContact Representative Permission granted to share information::  Yes, Verbal Permission Granted  Name::        Agency::     Relationship::     Contact Information:     Housing/Transportation Living arrangements for the past 2 months:  Skilled Nursing Facility Source of Information:  Facility, Medical Team Patient Interpreter Needed:  None Criminal Activity/Legal Involvement Pertinent to Current Situation/Hospitalization:  No - Comment as needed Significant Relationships:  Merchandiser, retailCommunity Support Lives with:  Facility Resident Do you feel safe going back to the place where you live?  Yes Need for family participation in patient care:  No (Coment)  Care giving concerns:  [facility]  Social Worker assessment / plan:  CSW contacted the facility to discuss discharge planning. The patient is a LTC resident at this facility, and he can return when stable via non-emergent EMS. The patient has a pressure wound that is being conservatively dressed. According to the attending MD, if he is stable, he will discharge tomorrow. The facility is aware.  Employment status:  Retired Health and safety inspectornsurance information:  [place] Recommendations:  Not assessed at this time Information / Referral to community resources:     Patient/Family's Response to care:  The patient and facility thanked CSW for assistance.  Patient/Family's Understanding of and Emotional Response to Diagnosis, Current Treatment, and Prognosis:  The patient understands that he will benefit from continued LTC.  Emotional Assessment Appearance:  Appears stated age Attitude/Demeanor/Rapport:   (Lethargic) Affect (typically observed):   Appropriate Orientation:  Oriented to Self, Oriented to Place, Oriented to  Time, Oriented to Situation Alcohol / Substance use:  Never Used Psych involvement (Current and /or in the community):  No (Comment)  Discharge Needs  Concerns to be addressed:  Care Coordination, Discharge Planning Concerns Readmission within the last 30 days:  Yes Current discharge risk:  Chronically ill Barriers to Discharge:  Continued Medical Work up   UAL CorporationKaren M Millie Shorb, LCSW 07/07/2017, 3:43 PM
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
## 2017-07-07 NOTE — Plan of Care (Signed)
Problem: Health Behavior/Discharge Planning: Goal: Ability to manage health-related needs will improve Outcome: Not Met (add Reason) Pt quadaplegic; unable to manage care.  Problem: Skin Integrity: Goal: Risk for impaired skin integrity will decrease Outcome: Not Progressing Wound consult submitted due to extensive pressure areas. Pt reluctant and refuses turns off back.  Problem: Physical Regulation: Goal: Signs and symptoms of infection will decrease Outcome: Progressing Sputum collected by respiratory. Remains congested with weak cough insufficient to expectorate secretions. WBC trending down. Afebrile

## 2017-07-07 NOTE — Progress Notes (Signed)
Ok per MD Pyreddy to irrigate Suprapubic catheter.

## 2017-07-07 NOTE — Progress Notes (Signed)
Irrigated suprapubic catheter, pt tolerated well.

## 2017-07-07 NOTE — Consult Note (Signed)
Pharmacy Antibiotic Note  Alexander CofferJames Baar is a 56 y.o. male admitted on 07/03/2017 with pneumonia and UTI.  Pharmacy has been consulted for cefepime and vancomycin dosing. Patient failed outpatient treatment with levofloxacin.   Plan: Vancomycin 750mg  IV x 1 today Vancomycin level 16 today, > 48h after last dose. Renal function difficult to estimate due to paraplegia but appears stable, vancomycin clearance somewhat varied. Based on calculated PK parameters will give 750mg  x 1 today and recheck level at 36 hours.  Target trough 15-1820mcg/ml  Continue cefepime 2g IV q 8 hours  Height: 5\' 9"  (175.3 cm) Weight: 130 lb (59 kg) IBW/kg (Calculated) : 70.7  Temp (24hrs), Avg:98.4 F (36.9 C), Min:98 F (36.7 C), Max:98.7 F (37.1 C)   Recent Labs Lab 07/03/17 1113 07/03/17 1653 07/04/17 0419 07/05/17 1359 07/06/17 0451 07/07/17 0621  WBC 27.3*  --  35.1*  --  25.0* 21.0*  CREATININE 0.69  --  0.65  --  0.52*  --   LATICACIDVEN 2.0* 1.9  --   --   --   --   VANCOTROUGH  --   --   --  37*  --   --   VANCORANDOM  --   --   --   --  26 16    Estimated Creatinine Clearance: 88.1 mL/min (A) (by C-G formula based on SCr of 0.52 mg/dL (L)).    Allergies  Allergen Reactions  . Ciprofloxacin Swelling  . Penicillins Rash    Antimicrobials this admission: levofloxacin 7/17 >> one dose aztreonam 7/17 >> one dose Vancomycin 7/17>> Cefepime 7/17>>  Dose adjustments this admission:   Microbiology results: 7/17 BCx: No growth 2 days 7/17 UCx:  >100K MRSA 7/17 MRSA PCR: Positive  Thank you for allowing pharmacy to be a part of this patient's care.  Kamie Korber C, Pharm.D, BCPS Clinical Pharmacist 07/07/2017 9:11 AM

## 2017-07-07 NOTE — NC FL2 (Addendum)
[place] MEDICAID FL2 LEVEL OF CARE SCREENING TOOL     IDENTIFICATION  Patient Name: Alexander Escobar Birthdate: 1961/04/10 Sex: male Admission Date (Current Location): 07/03/2017  [facility] and [place] Number:  Chiropodist and Address:  [facility], [address] [place], [place] [address]      Provider Number: [id number]  Attending Physician Name and Address:  Enedina Finner, MD  Relative Name and Phone Number:       Current Level of Care: Hospital Recommended Level of Care: Skilled Nursing Facility Prior Approval Number:    Date Approved/Denied:   PASRR Number:    Discharge Plan: SNF    Current Diagnoses: Patient Active Problem List   Diagnosis Date Noted  . Pressure injury of skin 07/04/2017  . Sepsis (HCC) 07/03/2017  . UTI (urinary tract infection) due to urinary indwelling Foley catheter (HCC) 07/03/2017  . Pneumonia 07/03/2017    Orientation RESPIRATION BLADDER Height & Weight     Self, Time, Situation  O2 (2L o2) Indwelling catheter (Suprapubic Catheter) Weight: 130 lb (59 kg) Height:  5\' 9"  (175.3 cm)  BEHAVIORAL SYMPTOMS/MOOD NEUROLOGICAL BOWEL NUTRITION STATUS      Continent    AMBULATORY STATUS COMMUNICATION OF NEEDS Skin   Total Care Verbally PU Stage and Appropriate Care PU Stage 1 Dressing: BID                     Personal Care Assistance Level of Assistance  Bathing, Feeding, Dressing Bathing Assistance: Maximum assistance Feeding assistance: Independent Dressing Assistance: Maximum assistance     Functional Limitations Info             SPECIAL CARE FACTORS FREQUENCY                       Contractures Contractures Info: Present    Additional Factors Info  Code Status, Allergies Code Status Info: DNR Allergies Info: Ciprofloxacin, Penicillins           Current Medications (07/07/2017):  This is the current hospital active medication list Current Facility-Administered Medications   Medication Dose Route Frequency Provider Last Rate Last Dose  . acetaminophen (TYLENOL) tablet 650 mg  650 mg Per Tube Q6H PRN Altamese Dilling, MD      . ALPRAZolam Prudy Feeler) tablet 0.5 mg  0.5 mg Per Tube QHS Altamese Dilling, MD   0.5 mg at 07/06/17 2255  . bisacodyl (DULCOLAX) suppository 10 mg  10 mg Rectal Daily PRN Enedina Finner, MD      . ceFEPIme (MAXIPIME) 2 g in dextrose 5 % 50 mL IVPB  2 g Intravenous Q8H Malena Peer D, RPH   Stopped at 07/07/17 1035  . Chlorhexidine Gluconate Cloth 2 % PADS 6 each  6 each Topical Q0600 Altamese Dilling, MD   6 each at 07/07/17 0503  . diphenhydrAMINE (BENADRYL) tablet 25 mg  25 mg Oral Q6H PRN Enedina Finner, MD      . docusate (COLACE) 50 MG/5ML liquid 200 mg  200 mg Per Tube QHS Enedina Finner, MD   200 mg at 07/05/17 2119  . docusate sodium (COLACE) capsule 100 mg  100 mg Oral BID PRN Altamese Dilling, MD      . doxepin (SINEQUAN) capsule 10 mg  10 mg Per Tube QHS Altamese Dilling, MD   10 mg at 07/06/17 2300  . enoxaparin (LOVENOX) injection 40 mg  40 mg Subcutaneous Q24H Altamese Dilling, MD   40 mg at 07/07/17 1037  .  feeding supplement (JEVITY 1.5 CAL/FIBER) liquid 237 mL  237 mL Per Tube 5 X Daily Altamese DillingVachhani, Vaibhavkumar, MD   237 mL at 07/07/17 1425  . feeding supplement (PRO-STAT SUGAR FREE 64) liquid 30 mL  30 mL Per Tube TID Enedina FinnerPatel, Sona, MD      . FLUoxetine (PROZAC) tablet 20 mg  20 mg Per Tube Daily Altamese DillingVachhani, Vaibhavkumar, MD   20 mg at 07/07/17 1052  . free water 120 mL  120 mL Per Tube 5 X Daily Enedina FinnerPatel, Sona, MD   120 mL at 07/07/17 1425  . free water 200 mL  200 mL Per Tube QID Enedina FinnerPatel, Sona, MD   200 mL at 07/07/17 1226  . [START ON 07/08/2017] furosemide (LASIX) tablet 20 mg  20 mg Per Tube Daily Enedina FinnerPatel, Sona, MD      . guaiFENesin (ROBITUSSIN) 100 MG/5ML solution 400 mg  20 mL Per Tube Q4H PRN Enedina FinnerPatel, Sona, MD      . HYDROcodone-acetaminophen (NORCO/VICODIN) 5-325 MG per tablet 1-2 tablet  1-2 tablet Per  Tube Q4H PRN Altamese DillingVachhani, Vaibhavkumar, MD   1 tablet at 07/07/17 1052  . hydrocortisone (ANUSOL-HC) suppository 25 mg  25 mg Rectal BID PRN Enedina FinnerPatel, Sona, MD      . ipratropium-albuterol (DUONEB) 0.5-2.5 (3) MG/3ML nebulizer solution 3 mL  3 mL Nebulization Q6H PRN Altamese DillingVachhani, Vaibhavkumar, MD   3 mL at 07/07/17 1121  . lidocaine (LIDODERM) 5 % 1 patch  1 patch Transdermal Q12H PRN Enedina FinnerPatel, Sona, MD      . magnesium hydroxide (MILK OF MAGNESIA) suspension 30 mL  30 mL Per Tube BID PRN Altamese DillingVachhani, Vaibhavkumar, MD      . MEDLINE mouth rinse  15 mL Mouth Rinse q12n4p Enedina FinnerPatel, Sona, MD   15 mL at 07/07/17 1433  . Melatonin TABS 5 mg  1 tablet Per Tube QHS Enedina FinnerPatel, Sona, MD      . multivitamin liquid 15 mL  15 mL Per Tube Daily Altamese DillingVachhani, Vaibhavkumar, MD   15 mL at 07/07/17 1052  . mupirocin ointment (BACTROBAN) 2 % 1 application  1 application Nasal BID Altamese DillingVachhani, Vaibhavkumar, MD   1 application at 07/07/17 1042  . ondansetron (ZOFRAN-ODT) disintegrating tablet 4 mg  4 mg Oral Q8H PRN Enedina FinnerPatel, Sona, MD   4 mg at 07/07/17 1245  . pantoprazole sodium (PROTONIX) 40 mg/20 mL oral suspension 40 mg  40 mg Per Tube QAC breakfast Altamese DillingVachhani, Vaibhavkumar, MD   40 mg at 07/07/17 0528  . polyethylene glycol (MIRALAX / GLYCOLAX) packet 17 g  17 g Oral Daily Enedina FinnerPatel, Sona, MD   17 g at 07/07/17 1045  . sennosides (SENOKOT) 8.8 MG/5ML syrup 10 mL  10 mL Per Tube QHS Altamese DillingVachhani, Vaibhavkumar, MD   10 mL at 07/05/17 2119  . sodium chloride flush (NS) 0.9 % injection 3 mL  3 mL Intravenous Q12H Enedina FinnerPatel, Sona, MD   3 mL at 07/07/17 1053  . sodium chloride flush (NS) 0.9 % injection 3 mL  3 mL Intravenous PRN Enedina FinnerPatel, Sona, MD   3 mL at 07/07/17 1224     Discharge Medications: Current Discharge Medication List       START taking these medications   Details  Amino Acids-Protein Hydrolys (FEEDING SUPPLEMENT, PRO-STAT SUGAR FREE 64,) LIQD Place 30 mLs into feeding tube 3 (three) times daily. Qty: 900 mL, Refills: 0    cefUROXime  (CEFTIN) 250 MG/5ML suspension Take 10 mLs (500 mg total) by mouth 2 (two) times daily. Qty: 100 mL, Refills:  0    Nutritional Supplements (FEEDING SUPPLEMENT, JEVITY 1.5 CAL/FIBER,) LIQD Place 237 mLs into feeding tube 5 (five) times daily. Qty: 300 mL, Refills: 0         CONTINUE these medications which have NOT CHANGED   Details  acetaminophen (TYLENOL) 325 MG tablet Take 650 mg by mouth every 6 (six) hours as needed.    ALPRAZolam (XANAX) 0.5 MG tablet Take 0.5 mg by mouth at bedtime.    bisacodyl (DULCOLAX) 10 MG suppository Place 10 mg rectally daily.    chlorpheniramine (CHLOR-TRIMETON) 4 MG tablet Take 4 mg by mouth 4 (four) times daily.    docusate (COLACE) 50 MG/5ML liquid Place 200 mg into feeding tube at bedtime.    doxepin (SINEQUAN) 10 MG capsule Take 10 mg by mouth at bedtime.    enoxaparin (LOVENOX) 40 MG/0.4ML injection Inject 40 mg into the skin daily.    FLUoxetine (PROZAC) 20 MG tablet Take 20 mg by mouth daily.    furosemide (LASIX) 20 MG tablet Take 20 mg by mouth daily.    guaiFENesin (ROBITUSSIN) 100 MG/5ML SOLN Take 20 mLs by mouth every 4 (four) hours as needed for cough or to loosen phlegm.    HYDROcodone-acetaminophen (NORCO/VICODIN) 5-325 MG tablet Take 1 tablet by mouth every 4 (four) hours as needed for moderate pain.    hydrocortisone (ANUSOL-HC) 25 MG suppository Place 25 mg rectally 2 (two) times daily.    ipratropium-albuterol (DUONEB) 0.5-2.5 (3) MG/3ML SOLN Take 3 mLs by nebulization every 6 (six) hours as needed.    Lidocaine 4 % PTCH Apply 3 patches topically 2 (two) times daily.    magnesium hydroxide (MILK OF MAGNESIA) 400 MG/5ML suspension Take 30 mLs by mouth 2 (two) times daily as needed for mild constipation.    Melatonin 3 MG TABS Take 1 tablet by mouth at bedtime.    metroNIDAZOLE (FLAGYL) 500 MG tablet Take 500 mg by mouth daily.    Misc Natural Products (OSTEO BI-FLEX ADV JOINT SHIELD PO) Take 1 tablet  by mouth daily.    Multiple Vitamin (MULTIVITAMIN) LIQD Take 15 mLs by mouth daily.    ondansetron (ZOFRAN-ODT) 4 MG disintegrating tablet Take 4 mg by mouth every 8 (eight) hours as needed for nausea or vomiting.    pantoprazole sodium (PROTONIX) 40 mg/20 mL PACK Place 40 mg into feeding tube daily.    polyethylene glycol (MIRALAX / GLYCOLAX) packet Take 17 g by mouth daily.    sennosides (SENOKOT) 8.8 MG/5ML syrup Place 10 mLs into feeding tube at bedtime.        Relevant Imaging Results:  Relevant Lab Results:   Additional Information SS# [id number]  Judi Cong, LCSW

## 2017-07-07 NOTE — Consult Note (Signed)
WOC Nurse wound consult note Reason for Consult: Reconsultation order noted.  Pressure injuries, POA.  Chronic, non-healing. Patient seen by my partner, K. Sanders, on 07/04/17 (2 days ago) for measurement and assessment of wounds, establishment of care plan. Patient is followed by [facility] in [place] and wound care orders are per that physician as well as patient and family request.  Conservative care orders  (NS cleanse, fluff-pack wounds with saline moistened gauze, cover and secure with paper tape) are in place.  I have added an additional scheduled dressing change to the once daily dressings and they are now twice daily to keep continually moist.  Wound type: Pressure Pressure Injury POA: Yes Dr. Allena KatzPatel notes today that if stable, patient may return to facility tomorrow.  WOC nursing team will not follow, but will remain available to this patient, the nursing and medical teams.  Please re-consult if needed. Thanks, Ladona MowLaurie Meera Vasco, MSN, RN, GNP, Hans EdenCWOCN, CWON-AP, FAAN  Pager# [phone]

## 2017-07-07 NOTE — Clinical Social Work Note (Addendum)
CSW is aware that patient is from [facility]. CSW will assess when able.  Argentina PonderKaren Martha Tameia Rafferty, MSW, Theresia MajorsLCSWA [phone]

## 2017-07-07 NOTE — Progress Notes (Signed)
CPT followed by 5-10 min of quad coughing

## 2017-07-08 LAB — CULTURE, BLOOD (ROUTINE X 2)
CULTURE: NO GROWTH
Culture: NO GROWTH
Special Requests: ADEQUATE

## 2017-07-08 LAB — CREATININE, SERUM
Creatinine, Ser: 0.7 mg/dL (ref 0.61–1.24)
GFR calc non Af Amer: >60 mL/min (ref >60)

## 2017-07-08 MED ORDER — CEFUROXIME AXETIL 250 MG/5ML PO SUSR: 500.0000 mg | Freq: Two times a day (BID) | ORAL | 0 refills | Status: AC

## 2017-07-08 MED ORDER — PRO-STAT SUGAR FREE PO LIQD: 30.0000 mL | Freq: Three times a day (TID) | ORAL | 0 refills | Status: AC

## 2017-07-08 MED ORDER — CEFUROXIME AXETIL 250 MG/5ML PO SUSR
500.0000 mg | Freq: Two times a day (BID) | ORAL | Status: DC
  Filled 2017-07-08 (×2): qty 10

## 2017-07-08 MED ORDER — JEVITY 1.5 CAL/FIBER PO LIQD: 237.0000 mL | Freq: Every day | ORAL | 0 refills | Status: DC

## 2017-07-08 NOTE — Clinical Social Work Note (Addendum)
Patient will discharge to [facility] via non-emergent EMS. The patient and the facility are aware. All documentation has been forwarded to the facility.   CSW contacted the patient's contact Elnita Maxwellheryl per his request. Elnita MaxwellCheryl asked the CSW about medical information concerning discharge. The CSW alerted Elnita MaxwellCheryl that the information would be best from his attending RN. CSW gave the contact the unit phone number. CSW will continue to follow pending additional discharge needs.   Argentina PonderKaren Martha Yamilet Mcfayden, MSW, Theresia MajorsLCSWA [phone]

## 2017-07-08 NOTE — Progress Notes (Signed)
TC to Cooperheryl and updated that EMS here to transport pt to [facility]. Discharge packet sent with EMS. Discharge to [facility] -SNF.
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
## 2017-07-08 NOTE — Plan of Care (Signed)
Problem: Skin Integrity: Goal: Risk for impaired skin integrity will decrease Outcome: Not Progressing Patient refuses to turn due to contractures and pain. Dressing changed to coccyx per order. Pt refused dressing changes to back.

## 2017-07-08 NOTE — Progress Notes (Signed)
TC report to Christiane HaJonathan at [facility] with pt accepted. AM care completed with decubitus dressings changed. Discharge packet ready with AVS printed. Pt has red allergy bracelet, purple DNR bracelet on.  Pt agreeable for discharge. Family is aware and ask for copy of AVS which I have printed ready for them.

## 2017-07-09 LAB — BLOOD GAS, VENOUS
Acid-Base Excess: 6.4 mmol/L — ABNORMAL HIGH (ref 0.0–2.0)
Bicarbonate: 32.2 mmol/L — ABNORMAL HIGH (ref 20.0–28.0)
Patient temperature: 37
pCO2, Ven: 52 mmHg (ref 44.0–60.0)
pH, Ven: 7.4 (ref 7.250–7.430)

## 2017-07-10 LAB — CULTURE, RESPIRATORY W GRAM STAIN

## 2017-07-10 LAB — CULTURE, RESPIRATORY

## 2017-07-20 ENCOUNTER — Emergency Department
Admission: EM | Admit: 2017-07-20 | Discharge: 2017-07-20 | Disposition: A | Discharge status: Home / Self Care | Payer: Medicaid Other | Attending: Emergency Medicine | Admitting: Emergency Medicine

## 2017-07-20 ENCOUNTER — Encounter: Payer: Self-pay | Admitting: Emergency Medicine

## 2017-07-20 DIAGNOSIS — T83020A Displacement of cystostomy catheter, initial encounter: Secondary | ICD-10-CM | POA: Diagnosis present

## 2017-07-20 DIAGNOSIS — Z79899 Other long term (current) drug therapy: Secondary | ICD-10-CM | POA: Diagnosis not present

## 2017-07-20 DIAGNOSIS — Y732 Prosthetic and other implants, materials and accessory gastroenterology and urology devices associated with adverse incidents: Secondary | ICD-10-CM | POA: Diagnosis not present

## 2017-07-20 DIAGNOSIS — Z87891 Personal history of nicotine dependence: Secondary | ICD-10-CM | POA: Insufficient documentation

## 2017-07-20 DIAGNOSIS — Z950 Presence of cardiac pacemaker: Secondary | ICD-10-CM | POA: Insufficient documentation

## 2017-07-20 DIAGNOSIS — T83010A Breakdown (mechanical) of cystostomy catheter, initial encounter: Secondary | ICD-10-CM

## 2017-07-20 NOTE — Discharge Instructions (Signed)
Pls change dressing daily or as needed if wet.

## 2017-07-20 NOTE — ED Triage Notes (Signed)
Pt arrived to ED per EMS from [facility] with reports of suprapubic catheter out of place since 0530 this AM. Pt denies pain, catheter with pt.

## 2017-07-20 NOTE — ED Provider Notes (Signed)
[facility] Emergency Department Provider Note  ____________________________________________  Time seen: Approximately 7:49 AM  I have reviewed the triage vital signs and the nursing notes.   HISTORY  Chief Complaint Abdominal Pain   HPI Alexander Escobar is a 56 y.o. male with a history of quadriplegia and suprapubic catheter who presents for dislodged catheter. Catheter dislodged 5:30 AM this morning. Patient's catheter was changed last time in April. Patient denies abdominal pain, nausea, vomiting, fever.  Past Medical History:  Diagnosis Date  . Congenital absence of external auditory canal   . Gastroesophageal reflux disease   . Presence of permanent cardiac pacemaker   . Quadriplegia (HCC)   . Recurrent major depression (HCC)   . Severe protein-calorie malnutrition [facility](HCC)     Patient Active Problem List   Diagnosis Date Noted  . Pressure injury of skin 07/04/2017  . Sepsis (HCC) 07/03/2017  . UTI (urinary tract infection) due to urinary indwelling Foley catheter (HCC) 07/03/2017  . Pneumonia 07/03/2017    Past Surgical History:  Procedure Laterality Date  . PEG TUBE PLACEMENT    . SUPRAPUBIC CATHETER INSERTION      Prior to Admission medications   Medication Sig Start Date End Date Taking? Authorizing Provider  acetaminophen (TYLENOL) 325 MG tablet Take 650 mg by mouth every 6 (six) hours as needed.    [provider]  ALPRAZolam Prudy Feeler(XANAX) 0.5 MG tablet Take 0.5 mg by mouth at bedtime.    [provider]  Amino Acids-Protein Hydrolys (FEEDING SUPPLEMENT, PRO-STAT SUGAR FREE 64,) LIQD Place 30 mLs into feeding tube 3 (three) times daily. 07/08/17   Enedina FinnerPatel, Sona, MD  bisacodyl (DULCOLAX) 10 MG suppository Place 10 mg rectally daily.    [provider]  chlorpheniramine (CHLOR-TRIMETON) 4 MG tablet Take 4 mg by mouth 4 (four) times daily.    [provider]  docusate (COLACE) 50 MG/5ML liquid Place 200 mg into  feeding tube at bedtime.    [provider]  doxepin (SINEQUAN) 10 MG capsule Take 10 mg by mouth at bedtime.    [provider]  enoxaparin (LOVENOX) 40 MG/0.4ML injection Inject 40 mg into the skin daily.    [provider]  FLUoxetine (PROZAC) 20 MG tablet Take 20 mg by mouth daily.    [provider]  furosemide (LASIX) 20 MG tablet Take 20 mg by mouth daily.    [provider]  guaiFENesin (ROBITUSSIN) 100 MG/5ML SOLN Take 20 mLs by mouth every 4 (four) hours as needed for cough or to loosen phlegm.    [provider]  HYDROcodone-acetaminophen (NORCO/VICODIN) 5-325 MG tablet Take 1 tablet by mouth every 4 (four) hours as needed for moderate pain.    [provider]  hydrocortisone (ANUSOL-HC) 25 MG suppository Place 25 mg rectally 2 (two) times daily.    [provider]  ipratropium-albuterol (DUONEB) 0.5-2.5 (3) MG/3ML SOLN Take 3 mLs by nebulization every 6 (six) hours as needed.    [provider]  Lidocaine 4 % PTCH Apply 3 patches topically 2 (two) times daily.    [provider]  magnesium hydroxide (MILK OF MAGNESIA) 400 MG/5ML suspension Take 30 mLs by mouth 2 (two) times daily as needed for mild constipation.    [provider]  Melatonin 3 MG TABS Take 1 tablet by mouth at bedtime.    [provider]  metroNIDAZOLE (FLAGYL) 500 MG tablet Take 500 mg by mouth daily.    [provider]  Misc Natural Products (OSTEO BI-FLEX ADV JOINT SHIELD PO) Take 1 tablet by mouth daily.    [provider]  Multiple Vitamin (MULTIVITAMIN) LIQD Take 15 mLs by mouth daily.    [provider]  Nutritional Supplements (FEEDING SUPPLEMENT, JEVITY 1.5 CAL/FIBER,) LIQD Place 237 mLs into feeding tube 5 (five) times daily. 07/08/17   Enedina Finner, MD  ondansetron (ZOFRAN-ODT) 4 MG disintegrating tablet Take 4 mg by mouth every 8 (eight) hours as needed for nausea or vomiting.     [provider]  pantoprazole sodium (PROTONIX) 40 mg/20 mL PACK Place 40 mg into feeding tube daily.    [provider]  polyethylene glycol (MIRALAX / GLYCOLAX) packet Take 17 g by mouth daily.    [provider]  sennosides (SENOKOT) 8.8 MG/5ML syrup Place 10 mLs into feeding tube at bedtime.    [provider]    Allergies Ciprofloxacin and Penicillins  Family History  Problem Relation Age of Onset  . CAD Mother   . CAD Father     Social History Social History  Substance Use Topics  . Smoking status: Former Games developer  . Smokeless tobacco: Never Used  . Alcohol use No    Review of Systems  Constitutional: Negative for fever. Eyes: Negative for visual changes. ENT: Negative for sore throat. Neck: No neck pain  Cardiovascular: Negative for chest pain. Respiratory: Negative for shortness of breath. Gastrointestinal: Negative for abdominal pain, vomiting or diarrhea. Genitourinary: Negative for dysuria. Musculoskeletal: Negative for back pain. Skin: Negative for rash. Neurological: Negative for headaches, weakness or numbness. Psych: No SI or HI  ____________________________________________   PHYSICAL EXAM:  VITAL SIGNS: ED Triage Vitals  Enc Vitals Group     BP 07/20/17 0703 138/90     Pulse Rate 07/20/17 0703 90     Resp 07/20/17 0703 16     Temp 07/20/17 0703 98.3 F (36.8 C)     Temp Source 07/20/17 0703 Oral     SpO2 07/20/17 0703 97 %     Weight 07/20/17 0701 130 lb (59 kg)     Height --      Head Circumference --      Peak Flow --      Pain Score --      Pain Loc --      Pain Edu? --      Excl. in GC? --     Constitutional: Alert and oriented. Well appearing and in no apparent distress. HEENT:      Head: Normocephalic and atraumatic.         Eyes: Conjunctivae are normal. Sclera is non-icteric.       Mouth/Throat: Mucous membranes are moist.       Neck: Supple with no signs of meningismus. Cardiovascular:  Regular rate and rhythm. No murmurs, gallops, or rubs. 2+ symmetrical distal pulses are present in all extremities. No JVD. Respiratory: Normal respiratory effort. Lungs are clear to auscultation bilaterally. No wheezes, crackles, or rhonchi.  Gastrointestinal: Soft, non tender, and non distended with positive bowel sounds. No rebound or guarding. Skin: Skin is warm, dry and intact. No rash noted. Psychiatric: Mood and affect are normal. Speech and behavior are normal.  ____________________________________________   LABS (all labs ordered are listed, but only abnormal results are displayed)  Labs Reviewed - No data to display ____________________________________________  EKG  none  ____________________________________________  RADIOLOGY  none  ____________________________________________   PROCEDURES  Procedure(s) performed:yes Procedures   Suprapubic Foley catheter replaced  16 French Balloon inflated with 10 cc of normal saline Urine return confirmed placement Dry dressing applied Patient tolerated without complication   Critical Care performed:  None ____________________________________________   INITIAL IMPRESSION / ASSESSMENT AND PLAN / ED COURSE  56 y.o. male with a history of quadriplegia and suprapubic catheter who presents for dislodged catheter. 16 French catheter was placed and with the urine return. Patient tolerated without difficulty. Patient be discharged back to his facility.     Pertinent labs & imaging results that were available during my care of the patient were reviewed by me and considered in my medical decision making (see chart for details).    ____________________________________________   FINAL CLINICAL IMPRESSION(S) / ED DIAGNOSES  Final diagnoses:  Suprapubic catheter dysfunction, initial encounter (HCC)      NEW MEDICATIONS STARTED DURING THIS VISIT:  New Prescriptions   No medications on file     Note:  This document  was prepared using Dragon voice recognition software and may include unintentional dictation errors.    Nita SickleVeronese, Nenzel, MD 07/20/17 [phone]

## 2018-04-22 ENCOUNTER — Other Ambulatory Visit: Payer: Self-pay

## 2018-04-22 ENCOUNTER — Emergency Department
Admission: EM | Admit: 2018-04-22 | Discharge: 2018-04-22 | Disposition: A | Discharge status: Skilled Nursing Facility | Payer: Medicaid Other | Attending: Emergency Medicine | Admitting: Emergency Medicine

## 2018-04-22 ENCOUNTER — Encounter: Payer: Self-pay | Admitting: Emergency Medicine

## 2018-04-22 DIAGNOSIS — Z436 Encounter for attention to other artificial openings of urinary tract: Secondary | ICD-10-CM | POA: Diagnosis not present

## 2018-04-22 DIAGNOSIS — Z79899 Other long term (current) drug therapy: Secondary | ICD-10-CM | POA: Insufficient documentation

## 2018-04-22 DIAGNOSIS — Z7901 Long term (current) use of anticoagulants: Secondary | ICD-10-CM | POA: Insufficient documentation

## 2018-04-22 DIAGNOSIS — Z87891 Personal history of nicotine dependence: Secondary | ICD-10-CM | POA: Diagnosis not present

## 2018-04-22 DIAGNOSIS — N390 Urinary tract infection, site not specified: Secondary | ICD-10-CM | POA: Insufficient documentation

## 2018-04-22 DIAGNOSIS — R339 Retention of urine, unspecified: Secondary | ICD-10-CM | POA: Diagnosis present

## 2018-04-22 DIAGNOSIS — T839XXA Unspecified complication of genitourinary prosthetic device, implant and graft, initial encounter: Secondary | ICD-10-CM

## 2018-04-22 DIAGNOSIS — T83511A Infection and inflammatory reaction due to indwelling urethral catheter, initial encounter: Secondary | ICD-10-CM

## 2018-04-22 DIAGNOSIS — R532 Functional quadriplegia: Secondary | ICD-10-CM | POA: Diagnosis not present

## 2018-04-22 DIAGNOSIS — Z950 Presence of cardiac pacemaker: Secondary | ICD-10-CM | POA: Insufficient documentation

## 2018-04-22 LAB — URINALYSIS, COMPLETE (UACMP) WITH MICROSCOPIC
Bilirubin Urine: NEGATIVE
GLUCOSE, UA: NEGATIVE mg/dL
Hgb urine dipstick: NEGATIVE
Ketones, ur: NEGATIVE mg/dL
Nitrite: NEGATIVE
PROTEIN: 30 mg/dL — AB
Specific Gravity, Urine: 1.012 (ref 1.005–1.030)
Squamous Epithelial / LPF: NONE SEEN (ref 0–5)
pH: 8 (ref 5.0–8.0)

## 2018-04-22 MED ORDER — CEPHALEXIN 500 MG PO CAPS: 500.0000 mg | ORAL_CAPSULE | Freq: Three times a day (TID) | ORAL | 0 refills | Status: DC

## 2018-04-22 MED ORDER — CEPHALEXIN 500 MG PO CAPS
500.0000 mg | ORAL_CAPSULE | Freq: Once | ORAL | Status: AC
  Administered 2018-04-22: 500 mg via ORAL
  Filled 2018-04-22: qty 1

## 2018-04-22 NOTE — ED Provider Notes (Signed)
[facility] Emergency Department Provider Note    ____________________________________________   I have reviewed the triage vital signs and the nursing notes.   HISTORY  Chief Complaint Urinary Retention   History limited by: Not Limited   HPI Alexander Escobar is a 57 y.o. male who presents to the emergency department today because of concern for suprapubic catheter occlusion. The patient states that he has been having issues with his catheter not draining for the past week. They have been attempting to manage it at his living facility. When they were still having issues today they sent him to the emergency department. Patient does have a history of quadriplegia. Denies any abdominal pain but states he does have pain to the back of his neck which he will get when he has urinary retention.   Per medical record review patient has a history of quadriplegia, has had ed visit in the past for suprapubic catheter replacement.   Past Medical History:  Diagnosis Date  . Congenital absence of external auditory canal   . Gastroesophageal reflux disease   . Presence of permanent cardiac pacemaker   . Quadriplegia (HCC)   . Recurrent major depression (HCC)   . Severe protein-calorie malnutrition [facility])     Patient Active Problem List   Diagnosis Date Noted  . Pressure injury of skin 07/04/2017  . Sepsis (HCC) 07/03/2017  . UTI (urinary tract infection) due to urinary indwelling Foley catheter (HCC) 07/03/2017  . Pneumonia 07/03/2017    Past Surgical History:  Procedure Laterality Date  . PEG TUBE PLACEMENT    . SUPRAPUBIC CATHETER INSERTION      Prior to Admission medications   Medication Sig Start Date End Date Taking? Authorizing Provider  acetaminophen (TYLENOL) 325 MG tablet Take 650 mg by mouth every 6 (six) hours as needed.    [provider]  ALPRAZolam Prudy Feeler) 0.5 MG tablet Take 0.5 mg by mouth at bedtime.    [provider]  Amino  Acids-Protein Hydrolys (FEEDING SUPPLEMENT, PRO-STAT SUGAR FREE 64,) LIQD Place 30 mLs into feeding tube 3 (three) times daily. 07/08/17   Enedina Finner, MD  bisacodyl (DULCOLAX) 10 MG suppository Place 10 mg rectally daily.    [provider]  chlorpheniramine (CHLOR-TRIMETON) 4 MG tablet Take 4 mg by mouth 4 (four) times daily.    [provider]  docusate (COLACE) 50 MG/5ML liquid Place 200 mg into feeding tube at bedtime.    [provider]  doxepin (SINEQUAN) 10 MG capsule Take 10 mg by mouth at bedtime.    [provider]  enoxaparin (LOVENOX) 40 MG/0.4ML injection Inject 40 mg into the skin daily.    [provider]  FLUoxetine (PROZAC) 20 MG tablet Take 20 mg by mouth daily.    [provider]  furosemide (LASIX) 20 MG tablet Take 20 mg by mouth daily.    [provider]  guaiFENesin (ROBITUSSIN) 100 MG/5ML SOLN Take 20 mLs by mouth every 4 (four) hours as needed for cough or to loosen phlegm.    [provider]  HYDROcodone-acetaminophen (NORCO/VICODIN) 5-325 MG tablet Take 1 tablet by mouth every 4 (four) hours as needed for moderate pain.    [provider]  hydrocortisone (ANUSOL-HC) 25 MG suppository Place 25 mg rectally 2 (two) times daily.    [provider]  ipratropium-albuterol (DUONEB) 0.5-2.5 (3) MG/3ML SOLN Take 3 mLs by nebulization every 6 (six) hours as needed.    [provider]  Lidocaine 4 %  PTCH Apply 3 patches topically 2 (two) times daily.    [provider]  magnesium hydroxide (MILK OF MAGNESIA) 400 MG/5ML suspension Take 30 mLs by mouth 2 (two) times daily as needed for mild constipation.    [provider]  Melatonin 3 MG TABS Take 1 tablet by mouth at bedtime.    [provider]  metroNIDAZOLE (FLAGYL) 500 MG tablet Take 500 mg by mouth daily.    [provider]  Misc Natural Products (OSTEO BI-FLEX ADV JOINT SHIELD PO) Take 1 tablet by  mouth daily.    [provider]  Multiple Vitamin (MULTIVITAMIN) LIQD Take 15 mLs by mouth daily.    [provider]  Nutritional Supplements (FEEDING SUPPLEMENT, JEVITY 1.5 CAL/FIBER,) LIQD Place 237 mLs into feeding tube 5 (five) times daily. 07/08/17   Enedina Finner, MD  ondansetron (ZOFRAN-ODT) 4 MG disintegrating tablet Take 4 mg by mouth every 8 (eight) hours as needed for nausea or vomiting.    [provider]  pantoprazole sodium (PROTONIX) 40 mg/20 mL PACK Place 40 mg into feeding tube daily.    [provider]  polyethylene glycol (MIRALAX / GLYCOLAX) packet Take 17 g by mouth daily.    [provider]  sennosides (SENOKOT) 8.8 MG/5ML syrup Place 10 mLs into feeding tube at bedtime.    [provider]    Allergies Ciprofloxacin and Penicillins  Family History  Problem Relation Age of Onset  . CAD Mother   . CAD Father     Social History Social History   Tobacco Use  . Smoking status: Former Games developer  . Smokeless tobacco: Never Used  Substance Use Topics  . Alcohol use: No  . Drug use: No    Review of Systems Constitutional: No fever/chills Eyes: No visual changes. ENT: No sore throat.  Cardiovascular: Denies chest pain. Respiratory: Denies shortness of breath. Gastrointestinal: No abdominal pain.  No nausea, no vomiting.  No diarrhea.   Genitourinary: Positive for suprapubic catheter not draining. Musculoskeletal: Positive for back pain. Skin: Negative for rash. Neurological: Negative for headaches, focal weakness or numbness.  ____________________________________________   PHYSICAL EXAM:  VITAL SIGNS: ED Triage Vitals  Enc Vitals Group     BP 04/22/18 1833 115/66     Pulse Rate 04/22/18 1833 73     Resp 04/22/18 1833 13     Temp 04/22/18 1833 98.2 F (36.8 C)     Temp Source 04/22/18 1833 Oral     SpO2 04/22/18 1833 99 %     Weight 04/22/18 1835 146 lb (66.2 kg)     Height 04/22/18 1835  (1.651  m)     Head Circumference --      Peak Flow --      Pain Score 04/22/18 1834 0   Constitutional: Alert and oriented. Well appearing and in no distress. Eyes: Conjunctivae are normal.  ENT   Head: Normocephalic and atraumatic.   Nose: No congestion/rhinnorhea.   Mouth/Throat: Mucous membranes are moist.   Neck: No stridor. Hematological/Lymphatic/Immunilogical: No cervical lymphadenopathy. Cardiovascular: Normal rate, regular rhythm.  No murmurs, rubs, or gallops.  Respiratory: Normal respiratory effort without tachypnea nor retractions. Breath sounds are clear and equal bilaterally. No wheezes/rales/rhonchi. Gastrointestinal: Soft and non tender. No rebound. No guarding.  Genitourinary: Suprapubic catheter in place.  Musculoskeletal: Atrophic extremities.  Neurologic:  Sequale of quadriplegia.  Skin:  Skin is warm, dry and intact. No rash noted. Psychiatric: Mood and affect are normal. Speech and behavior are  normal. Patient exhibits appropriate insight and judgment.  ____________________________________________    LABS (pertinent positives/negatives)  UA consistent with infection  ____________________________________________   EKG  None  ____________________________________________    RADIOLOGY  None   ____________________________________________   PROCEDURES  Procedures  Suprapubic Foley Exchange 16 French Suprapubic Foley catheter replaced Balloon inflated with 10 cc of normal saline Urine return confirmed placement Dry dressing applied Patient tolerated without complication  ____________________________________________   INITIAL IMPRESSION / ASSESSMENT AND PLAN / ED COURSE  Pertinent labs & imaging results that were available during my care of the patient were reviewed by me and considered in my medical decision making (see chart for details).  Patient presented to the emergency department today because of concerns for supra pubic  catheter obstruction.  Suprapubic catheter was changed.  Urine is consistent with infection.  Will give patient urology follow-up information.  Will give patient first dose of antibiotics in the emergency department.  Discussed findings and plan with patient.   ____________________________________________   FINAL CLINICAL IMPRESSION(S) / ED DIAGNOSES  Final diagnoses:  Problem with Foley catheter, initial encounter (HCC)  Urinary tract infection associated with indwelling urethral catheter, initial encounter [facility])     Note: This dictation was prepared with Dragon dictation. Any transcriptional errors that result from this process are unintentional     Phineas Semen, MD 04/22/18 1943

## 2018-04-22 NOTE — ED Notes (Signed)
Called for transport to [facility]   [phone]

## 2018-04-22 NOTE — ED Triage Notes (Signed)
Pt via ems from [facility] with occluded suprapubic urinary catheter. His urine output is reduced due to occlusion. Pt was treated for the same at [facility] care twice in the last week and facility physician had him sent to ED today for treatment. Pt alert & oriented with NAD noted.

## 2018-04-22 NOTE — Discharge Instructions (Addendum)
Please seek medical attention for any high fevers, chest pain, shortness of breath, change in behavior, persistent vomiting, bloody stool or any other new or concerning symptoms.  

## 2018-04-22 NOTE — ED Notes (Signed)
Old catheter removed and replaced with 16 french catheter by Dr. Derrill Kay.

## 2018-04-26 ENCOUNTER — Ambulatory Visit (INDEPENDENT_AMBULATORY_CARE_PROVIDER_SITE_OTHER): Payer: Medicaid Other | Admitting: Urology

## 2018-04-26 ENCOUNTER — Encounter: Payer: Self-pay | Admitting: Urology

## 2018-04-26 VITALS — BP 117/70 | HR 67 | Ht 65.0 in

## 2018-04-26 DIAGNOSIS — G8250 Quadriplegia, unspecified: Secondary | ICD-10-CM | POA: Diagnosis not present

## 2018-04-26 DIAGNOSIS — N319 Neuromuscular dysfunction of bladder, unspecified: Secondary | ICD-10-CM

## 2018-04-26 DIAGNOSIS — N390 Urinary tract infection, site not specified: Secondary | ICD-10-CM | POA: Diagnosis not present
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
## 2018-05-07 ENCOUNTER — Other Ambulatory Visit: Payer: Self-pay

## 2018-05-08 ENCOUNTER — Ambulatory Visit
Admission: RE | Admit: 2018-05-08 | Discharge: 2018-05-08 | Disposition: A | Discharge status: Home / Self Care | Payer: Medicaid Other | Source: Ambulatory Visit | Attending: Urology | Admitting: Urology

## 2018-05-08 DIAGNOSIS — N281 Cyst of kidney, acquired: Secondary | ICD-10-CM | POA: Diagnosis not present

## 2018-05-08 DIAGNOSIS — N390 Urinary tract infection, site not specified: Secondary | ICD-10-CM | POA: Diagnosis not present

## 2018-05-09 ENCOUNTER — Telehealth: Payer: Self-pay | Admitting: Family Medicine

## 2018-05-09 NOTE — Telephone Encounter (Signed)
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  There are multiple cysts in the left kidney which do not require treatment or evaluation.

## 2018-05-09 NOTE — Telephone Encounter (Signed)
Patient's POA was notified.

## 2018-05-18 ENCOUNTER — Other Ambulatory Visit: Payer: Self-pay

## 2018-05-18 ENCOUNTER — Inpatient Hospital Stay
Admission: EM | Admit: 2018-05-18 | Discharge: 2018-05-19 | DRG: 377 | Disposition: A | Discharge status: Skilled Nursing Facility | Payer: Medicaid Other | Source: Skilled Nursing Facility | Attending: Internal Medicine | Admitting: Internal Medicine

## 2018-05-18 ENCOUNTER — Encounter: Payer: Self-pay | Admitting: Emergency Medicine

## 2018-05-18 DIAGNOSIS — J189 Pneumonia, unspecified organism: Secondary | ICD-10-CM | POA: Diagnosis present

## 2018-05-18 DIAGNOSIS — D500 Iron deficiency anemia secondary to blood loss (chronic): Secondary | ICD-10-CM | POA: Diagnosis not present

## 2018-05-18 DIAGNOSIS — Z8249 Family history of ischemic heart disease and other diseases of the circulatory system: Secondary | ICD-10-CM

## 2018-05-18 DIAGNOSIS — F339 Major depressive disorder, recurrent, unspecified: Secondary | ICD-10-CM | POA: Diagnosis present

## 2018-05-18 DIAGNOSIS — Z880 Allergy status to penicillin: Secondary | ICD-10-CM | POA: Diagnosis not present

## 2018-05-18 DIAGNOSIS — Z87891 Personal history of nicotine dependence: Secondary | ICD-10-CM | POA: Diagnosis not present

## 2018-05-18 DIAGNOSIS — K921 Melena: Secondary | ICD-10-CM | POA: Diagnosis present

## 2018-05-18 DIAGNOSIS — D649 Anemia, unspecified: Secondary | ICD-10-CM | POA: Diagnosis not present

## 2018-05-18 DIAGNOSIS — Z950 Presence of cardiac pacemaker: Secondary | ICD-10-CM

## 2018-05-18 DIAGNOSIS — R195 Other fecal abnormalities: Secondary | ICD-10-CM | POA: Diagnosis not present

## 2018-05-18 DIAGNOSIS — Z931 Gastrostomy status: Secondary | ICD-10-CM

## 2018-05-18 DIAGNOSIS — K219 Gastro-esophageal reflux disease without esophagitis: Secondary | ICD-10-CM | POA: Diagnosis present

## 2018-05-18 DIAGNOSIS — K922 Gastrointestinal hemorrhage, unspecified: Secondary | ICD-10-CM | POA: Diagnosis present

## 2018-05-18 DIAGNOSIS — I9589 Other hypotension: Secondary | ICD-10-CM | POA: Diagnosis present

## 2018-05-18 DIAGNOSIS — G8250 Quadriplegia, unspecified: Secondary | ICD-10-CM | POA: Diagnosis present

## 2018-05-18 DIAGNOSIS — K3189 Other diseases of stomach and duodenum: Secondary | ICD-10-CM | POA: Diagnosis not present

## 2018-05-18 LAB — PROTIME-INR
INR: 1.13
PROTHROMBIN TIME: 14.4 s (ref 11.4–15.2)

## 2018-05-18 LAB — CBC WITH DIFFERENTIAL/PLATELET
BASOS ABS: 0.1 10*3/uL (ref 0–0.1)
Basophils Relative: 1 %
EOS ABS: 1 10*3/uL — AB (ref 0–0.7)
EOS PCT: 10 %
HCT: 23 % — ABNORMAL LOW (ref 40.0–52.0)
Hemoglobin: 7.3 g/dL — ABNORMAL LOW (ref 13.0–18.0)
LYMPHS PCT: 11 %
Lymphs Abs: 1 10*3/uL (ref 1.0–3.6)
MCH: 28.4 pg (ref 26.0–34.0)
MCHC: 31.9 g/dL — ABNORMAL LOW (ref 32.0–36.0)
MCV: 88.9 fL (ref 80.0–100.0)
MONO ABS: 1.4 10*3/uL — AB (ref 0.2–1.0)
Monocytes Relative: 14 %
Neutro Abs: 6.3 10*3/uL (ref 1.4–6.5)
Neutrophils Relative %: 64 %
PLATELETS: 511 10*3/uL — AB (ref 150–440)
RBC: 2.59 MIL/uL — AB (ref 4.40–5.90)
RDW: 17.2 % — AB (ref 11.5–14.5)
WBC: 9.8 10*3/uL (ref 3.8–10.6)

## 2018-05-18 LAB — RETICULOCYTES
RBC.: 3.06 MIL/uL — ABNORMAL LOW (ref 4.40–5.90)
RETIC CT PCT: 2.3 % (ref 0.4–3.1)
Retic Count, Absolute: 70.4 10*3/uL (ref 19.0–183.0)

## 2018-05-18 LAB — BASIC METABOLIC PANEL
Anion gap: 9 (ref 5–15)
BUN: 54 mg/dL — AB (ref 6–20)
CALCIUM: 8.5 mg/dL — AB (ref 8.9–10.3)
CO2: 26 mmol/L (ref 22–32)
CREATININE: 1 mg/dL (ref 0.61–1.24)
Chloride: 101 mmol/L (ref 101–111)
GFR calc Af Amer: >60 mL/min (ref >60)
GLUCOSE: 93 mg/dL (ref 65–99)
Potassium: 4.3 mmol/L (ref 3.5–5.1)
SODIUM: 136 mmol/L (ref 135–145)

## 2018-05-18 LAB — ABO/RH: ABO/RH(D): A POS

## 2018-05-18 LAB — PREPARE RBC (CROSSMATCH)

## 2018-05-18 LAB — HEMOGLOBIN AND HEMATOCRIT, BLOOD
HCT: 26 % — ABNORMAL LOW (ref 40.0–52.0)
HEMOGLOBIN: 8.5 g/dL — AB (ref 13.0–18.0)

## 2018-05-18 MED ORDER — PRO-STAT SUGAR FREE PO LIQD
30.0000 mL | Freq: Three times a day (TID) | ORAL | Status: DC
  Administered 2018-05-18: 30 mL

## 2018-05-18 MED ORDER — FLUOXETINE HCL 20 MG/5ML PO SOLN
20.0000 mg | Freq: Every day | ORAL | Status: DC
  Administered 2018-05-19: 20 mg
  Filled 2018-05-18: qty 5

## 2018-05-18 MED ORDER — ACETAMINOPHEN 325 MG PO TABS
650.0000 mg | ORAL_TABLET | Freq: Four times a day (QID) | ORAL | Status: DC | PRN
Start: 2018-05-18 — End: 2018-05-19

## 2018-05-18 MED ORDER — BACLOFEN 10 MG PO TABS: 10.0000 mg | ORAL_TABLET | Freq: Two times a day (BID) | ORAL | Status: DC

## 2018-05-18 MED ORDER — SODIUM CHLORIDE 0.9 % IV SOLN: 10.0000 mL/h | Freq: Once | INTRAVENOUS | Status: AC

## 2018-05-18 MED ORDER — ONDANSETRON HCL 4 MG PO TABS: 4.0000 mg | ORAL_TABLET | Freq: Four times a day (QID) | ORAL | Status: DC | PRN

## 2018-05-18 MED ORDER — MAGNESIUM HYDROXIDE 400 MG/5ML PO SUSP
30.0000 mL | Freq: Two times a day (BID) | ORAL | Status: DC | PRN
  Filled 2018-05-18: qty 30

## 2018-05-18 MED ORDER — SODIUM CHLORIDE 0.9 % IV SOLN
INTRAVENOUS | Status: DC
  Administered 2018-05-18 – 2018-05-19 (×2): via INTRAVENOUS

## 2018-05-18 MED ORDER — PANTOPRAZOLE SODIUM 40 MG IV SOLR
8.0000 mg/h | INTRAVENOUS | Status: DC
  Administered 2018-05-18 – 2018-05-19 (×3): 8 mg/h via INTRAVENOUS
  Filled 2018-05-18 (×3): qty 80

## 2018-05-18 MED ORDER — MELATONIN 5 MG PO TABS
5.0000 mg | ORAL_TABLET | Freq: Every day | ORAL | Status: DC
  Administered 2018-05-18: 5 mg
  Filled 2018-05-18 (×2): qty 1

## 2018-05-18 MED ORDER — FUROSEMIDE 20 MG PO TABS: 20.0000 mg | ORAL_TABLET | Freq: Every day | ORAL | Status: DC

## 2018-05-18 MED ORDER — HYDROCODONE-ACETAMINOPHEN 10-325 MG PO TABS
1.0000 | ORAL_TABLET | Freq: Four times a day (QID) | ORAL | Status: DC | PRN
  Administered 2018-05-18 – 2018-05-19 (×4): 1
  Filled 2018-05-18 (×5): qty 1

## 2018-05-18 MED ORDER — VITAMIN C 500 MG PO TABS
500.0000 mg | ORAL_TABLET | Freq: Every day | ORAL | Status: DC
  Filled 2018-05-18: qty 1

## 2018-05-18 MED ORDER — BACLOFEN 10 MG PO TABS
20.0000 mg | ORAL_TABLET | Freq: Every day | ORAL | Status: DC
  Administered 2018-05-18: 20 mg via ORAL
  Filled 2018-05-18 (×2): qty 2

## 2018-05-18 MED ORDER — LORATADINE 10 MG PO TABS: 10.0000 mg | ORAL_TABLET | Freq: Every day | ORAL | Status: DC

## 2018-05-18 MED ORDER — CEFTRIAXONE SODIUM 1 G IJ SOLR
1.0000 g | Freq: Every day | INTRAMUSCULAR | Status: DC
  Filled 2018-05-18: qty 10

## 2018-05-18 MED ORDER — CHLORHEXIDINE GLUCONATE 0.12 % MT SOLN
15.0000 mL | Freq: Two times a day (BID) | OROMUCOSAL | Status: DC
  Administered 2018-05-18: 15 mL via OROMUCOSAL
  Filled 2018-05-18: qty 15

## 2018-05-18 MED ORDER — DOXEPIN HCL 10 MG PO CAPS
10.0000 mg | ORAL_CAPSULE | Freq: Every day | ORAL | Status: DC
  Administered 2018-05-18: 10 mg
  Filled 2018-05-18 (×2): qty 1

## 2018-05-18 MED ORDER — ACETAMINOPHEN 650 MG RE SUPP: 650.0000 mg | Freq: Four times a day (QID) | RECTAL | Status: DC | PRN

## 2018-05-18 MED ORDER — BISACODYL 10 MG RE SUPP: 10.0000 mg | Freq: Every day | RECTAL | Status: DC

## 2018-05-18 MED ORDER — SODIUM CHLORIDE 0.9 % IV SOLN
80.0000 mg | Freq: Once | INTRAVENOUS | Status: AC
  Administered 2018-05-18: 80 mg via INTRAVENOUS
  Filled 2018-05-18: qty 80

## 2018-05-18 MED ORDER — IPRATROPIUM-ALBUTEROL 0.5-2.5 (3) MG/3ML IN SOLN: 3.0000 mL | RESPIRATORY_TRACT | Status: DC | PRN

## 2018-05-18 MED ORDER — ORAL CARE MOUTH RINSE: 15.0000 mL | Freq: Two times a day (BID) | OROMUCOSAL | Status: DC

## 2018-05-18 MED ORDER — DIPHENHYDRAMINE HCL 12.5 MG/5ML PO ELIX
25.0000 mg | ORAL_SOLUTION | Freq: Three times a day (TID) | ORAL | Status: DC | PRN
  Administered 2018-05-18 – 2018-05-19 (×4): 25 mg
  Filled 2018-05-18 (×5): qty 10

## 2018-05-18 MED ORDER — DOCUSATE SODIUM 50 MG/5ML PO LIQD
200.0000 mg | Freq: Every day | ORAL | Status: DC
  Administered 2018-05-18: 200 mg
  Filled 2018-05-18 (×2): qty 20

## 2018-05-18 MED ORDER — CLARITHROMYCIN 500 MG PO TABS
500.0000 mg | ORAL_TABLET | Freq: Two times a day (BID) | ORAL | Status: DC
  Administered 2018-05-18 – 2018-05-19 (×2): 500 mg via ORAL
  Filled 2018-05-18 (×3): qty 1

## 2018-05-18 MED ORDER — POLYETHYLENE GLYCOL 3350 17 G PO PACK: 17.0000 g | PACK | Freq: Every day | ORAL | Status: DC

## 2018-05-18 MED ORDER — ALPRAZOLAM 0.5 MG PO TABS
0.5000 mg | ORAL_TABLET | Freq: Every day | ORAL | Status: DC
  Administered 2018-05-18: 0.5 mg
  Filled 2018-05-18: qty 1

## 2018-05-18 MED ORDER — SODIUM CHLORIDE 0.9 % IV SOLN: 8.0000 mg/h | INTRAVENOUS | Status: DC

## 2018-05-18 MED ORDER — ONDANSETRON HCL 4 MG/2ML IJ SOLN: 4.0000 mg | Freq: Four times a day (QID) | INTRAMUSCULAR | Status: DC | PRN

## 2018-05-18 MED ORDER — JEVITY 1.2 CAL PO LIQD
1000.0000 mL | ORAL | Status: DC
  Administered 2018-05-18: 30 mL

## 2018-05-18 MED ORDER — BACLOFEN 10 MG PO TABS
10.0000 mg | ORAL_TABLET | Freq: Every morning | ORAL | Status: DC
  Administered 2018-05-19: 10 mg via ORAL
  Filled 2018-05-18: qty 1

## 2018-05-18 MED ORDER — DIPHENHYDRAMINE HCL 25 MG PO TABS: 25.0000 mg | ORAL_TABLET | Freq: Three times a day (TID) | ORAL | Status: DC | PRN

## 2018-05-18 MED ORDER — PANTOPRAZOLE SODIUM 40 MG IV SOLR: 40.0000 mg | Freq: Two times a day (BID) | INTRAVENOUS | Status: DC

## 2018-05-18 MED ORDER — SENNOSIDES 8.8 MG/5ML PO SYRP
10.0000 mL | ORAL_SOLUTION | Freq: Every day | ORAL | Status: DC
  Filled 2018-05-18 (×2): qty 10

## 2018-05-18 MED ORDER — MELATONIN 3 MG PO TABS
1.0000 | ORAL_TABLET | Freq: Every day | ORAL | Status: DC
  Filled 2018-05-18: qty 1

## 2018-05-18 MED ORDER — SODIUM CHLORIDE 0.9 % IV SOLN
1.0000 g | INTRAVENOUS | Status: DC
  Administered 2018-05-18: 1 g via INTRAVENOUS
  Filled 2018-05-18: qty 1
  Filled 2018-05-18 (×2): qty 10

## 2018-05-18 NOTE — ED Notes (Signed)
Per HCPOA, pt Hb 4.8. Blood work checked Wednesday evening.

## 2018-05-18 NOTE — Progress Notes (Signed)
Advanced care plan. Purpose of the Encounter: CODE STATUS Parties in Attendance: Patient and family Patient's Decision Capacity: Good Subjective/Patient's story: Presented to emergency room with weakness, fatigue and low hemoglobin Objective/Medical story Has gastrointestinal bleeding Needs PRBC transfusions Goals of care determination:  Advance care directives and goals of care discussed Patient wants cardiac resuscitation, intubation and ventilator if the need arises CODE STATUS: Full code Time spent discussing advanced care planning: 16 minutes

## 2018-05-18 NOTE — Progress Notes (Signed)
Pt arrived to unit with x2 family members. Per male family member she is POA. Addressed with pt that he is ok with family members in room. He is also alert and oriented x4. He states it is ok for his POA to make decisions if he is unable.   Attempted to speak with patient and continuously interrupted by family members regarding "proper care of Jeremey". Male family member stated "we've had problems with this facility before and we didn't want to come here". Educated all in room that staff would care for patient appropriately and to the best of our ability.   Again attempted to speak with patient regarding blood consent for transfusion. Again interrupted by POA and then patient immediately stated that "I don't want blood unless I have to have it. They told me my blood was very low and I was gonna die". Educated pt on hgb level and pt stated "Well if it's that high, then I don't want blood". Interrupted by POA to ask when patient was going to see wound care nurse. Educated all in room that wound care nurse would be consulted, but staff are unable to pack wound at this time. Staff would keep wound clean until orders can be written. Informed by patient and family member that staff needed to get orders asap or "I'm gonna get agitated". Educated that staff are only able to perform tasks as ordered.   Educated pt on NPO status. Pt stated he needed his feedings and water via his tube. Educated pt on need to remain NPO for treatment. Pt and family requested to speak with MD regarding plan of care stating they had not seen MD.   Paged Dr Tobi Bastos and received return phone call. Asked MD to come to room to discuss blood and treatment with patient due to his voiced concerns and issues with staff, facility and currently plan of care. MD stated that he had seen pt in the ED and he was unable to come to the room. Reiterated to MD that it was important to patient that he come to room to discuss plan of care. MD stated pt  needed at least one unit of blood. Educated MD that pt did not want blood and wanted to have his feedings resumed. Educated MD that RN needed tube feeding orders or change in NPO status for feedings or fluids to be resumed. MD again stated he would be unable to come to speak with pt at this time and he would "be up when I can".   Educated pt that MD would be up when he was able, but he was in the ED. Family member stated he was unhappy with MD's care and was unhappy with facility.   Assessed pt's skin with Zollie Scale RN. Pt's family member states pt can only turn to right side due to contracted left arm. Wound noted to sacrum. Dark stool noted and cleaned. New brief placed. Suprapubic catheter noted.    Pt's family member stated that "I'll wheel him out of here if we have to. This facility is ridiculous". Educated pt and family member that this was his right.   Pt stated "well I'm not gonna stay here if I can't get my pain medication and eat or drink". Educated pt that he has the right to leave AMA if he is unhappy but staff are doing all we are able to do.   Pt stated "I understand you are doing your job, but I'm not happy". Educated pt that [employer]  would notify RN supervisor and work toward a resolution. Pt verbalizes understanding.

## 2018-05-18 NOTE — Clinical Social Work Note (Signed)
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  Yes Need for family participation in patient care:  Yes (Comment)  Care giving concerns:  Patient is a long term care resident at [facility].    Social Worker assessment / plan:  Holiday representative ([place]) reviewed chart and noted that patient is from [facility]. CSW contacted [facility] admissions coordinator at [facility] and made her aware that patient is being admitted to [facility]. Per Claiborne Billings patient is a long term care resident and can return when medically stable. Patient has an advanced directive scanned into the chart stating that HPOA is Alexander Escobar and Burr Medico. CSW met with patient alone in the ED prior to moving to [facility]. Patient was alert and oriented X3 and was laying in the bed. CSW introduced self and explained role of CSW department. Patient reported that he has been at [facility] for about 1 year now. Patient reported that he wants to go to another facility. CSW explained to patient that medicaid beds are limited  and he will have to return to [facility] from [facility]. CSW made patient aware that he can speak to the social worker at [facility] about moving to a different facility from there. Patient is agreeable to return to [facility] and verbalized his understanding. Per patient his cousin Alexander Escobar is his HPOA and Robert's wife Alexander Escobar is also his HPOA.   CSW met with Alexander Escobar and Alexander Escobar and escorted them to [facility]. Per Alexander Escobar she has been trying to get Dr. Nyra Capes at [facility] to sign an order for a blood transfusion for a few days now. Per Alexander Escobar patient will return to [facility] and she will work with patient and the Education officer, museum at [facility] to move patient to a different facility.   Employment status:  Disabled (Comment on whether or not currently receiving Disability) Insurance information:  [place] PT Recommendations:  Not assessed at this time Information / Referral to community resources:  [place]  Patient/Family's Response to care:  Patient's HPOA Alexander Escobar and Alexander Escobar are very supportive and involved in patient's care. They are agreeable for patient to return to [facility].   Patient/Family's Understanding of and Emotional Response to Diagnosis, Current Treatment, and Prognosis:  Patient's HPOA Alexander Escobar and Alexander Escobar are agreeable to admission to [facility] and medical work up.   Emotional Assessment Appearance:  Appears stated age Attitude/Demeanor/Rapport:    Affect (typically observed):  Accepting, Adaptable, Pleasant Orientation:  Oriented to Self, Oriented to Place,  Oriented to  Time, Oriented to Situation Alcohol / Substance use:  Not Applicable Psych involvement (Current and /or in the community):  No (Comment)  Discharge Needs  Concerns to be addressed:  Discharge Planning Concerns Readmission within the last 30 days:  No Current discharge risk:  Dependent with Mobility, Chronically ill Barriers to Discharge:   Continued Medical Work up   [employer], Veronia Beets, LCSW 05/18/2018, 2:01 PM
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
## 2018-05-18 NOTE — ED Notes (Signed)
This RN spoke with Gwen in the lab to f/u on blood work. Still not in process. Dedra SkeensGwen will "work on receiving it now."

## 2018-05-18 NOTE — Progress Notes (Signed)
Pt snoring at this time. NAD.

## 2018-05-18 NOTE — Progress Notes (Signed)
Pt refused bed alarms. Pt refused CBG monitoring. Pt educated on importance of each. Pt verbalizes understanding and verbalizes refusal.

## 2018-05-18 NOTE — Progress Notes (Signed)
Pt provided with flat call bell suitable for his paralysis. Pt educated and aware of how to use call bell.
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
## 2018-05-18 NOTE — ED Triage Notes (Signed)
Pt arrives via [facility] from [facility] with outpatient orders for a blood transfusion. Per paperwork, Charlott RakesFrancisco Hodges referred patient and ordered "2 units PRBC, type & cross, give both units one time only for 1 day."   Pt is a quadriplegic, has foley catheter in place and feeding tube.
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
## 2018-05-18 NOTE — [place] FL2 (Signed)
[place] MEDICAID FL2 LEVEL OF CARE SCREENING TOOL     IDENTIFICATION  Patient Name: Alexander Escobar Birthdate: 08/28/1961 Sex: male Admission Date (Current Location): 05/18/2018  [facility] [place]  [name] ([id number]) Facility and Address:  [facility], [address], [place], [place] [address]      Provider Number: [id number]  Attending Physician Name and Address:  Ihor AustinPyreddy, Pavan, MD  Relative Name and Phone Number:       Current Level of Care: Hospital Recommended Level of Care: Skilled Nursing Facility Prior Approval Number:    Date Approved/Denied:   PASRR Number: ([id number] [id number] )  Discharge Plan: SNF    Current Diagnoses: Patient Active Problem List   Diagnosis Date Noted  . GI bleed 05/18/2018  . Pressure injury of skin 07/04/2017  . Sepsis (HCC) 07/03/2017  . UTI (urinary tract infection) due to urinary indwelling Foley catheter (HCC) 07/03/2017  . Pneumonia 07/03/2017    Orientation RESPIRATION BLADDER Height & Weight     Self, Time, Place  Normal Incontinent(super-pubic cath) Weight: 150 lb (68 kg) Height:  5\' 5"  (165.1 cm)  BEHAVIORAL SYMPTOMS/MOOD NEUROLOGICAL BOWEL NUTRITION STATUS      Continent Feeding tube  AMBULATORY STATUS COMMUNICATION OF NEEDS Skin   Total Care Verbally Normal                       Personal Care Assistance Level of Assistance  Bathing, Feeding, Dressing Bathing Assistance: Maximum assistance Feeding assistance: Maximum assistance Dressing Assistance: Maximum assistance     Functional Limitations Info  Sight, Hearing, Speech Sight Info: Adequate Hearing Info: Adequate Speech Info: Adequate    SPECIAL CARE FACTORS FREQUENCY                       Contractures      Additional Factors Info  Code Status, Allergies, Isolation Precautions Code Status Info: (Full Code. 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 )     Current Medications (05/18/2018):  This is the current hospital active medication list Current Facility-Administered Medications  Medication Dose Route Frequency Provider Last Rate Last Dose  . 0.9 %  sodium chloride infusion  10 mL/hr Intravenous Once [name], Braven A, MD      . pantoprazole (PROTONIX) 80 mg in sodium chloride 0.9 % 250 mL (0.32 mg/mL) infusion  8 mg/hr Intravenous Continuous [name], [name], MD 25 mL/hr at 05/18/18 1303 8 mg/hr at 05/18/18 1303  . [START ON 05/21/2018] pantoprazole (PROTONIX) injection 40 mg  40 mg Intravenous Q12H [name], [name], MD       Current Outpatient Medications  Medication Sig Dispense Refill  . ALPRAZolam (XANAX) 0.5 MG tablet Place 0.5 mg into feeding tube at bedtime.     . Amino Acids-Protein Hydrolys (FEEDING SUPPLEMENT, PRO-STAT SUGAR FREE 64,) LIQD Place 30 mLs into feeding tube 3 (three) times daily. (Patient taking differently: Place 30 mLs into feeding tube daily. ) 900 mL 0  . ascorbic acid (VITAMIN C) 500 MG tablet Place 500 mg into feeding tube daily.    . baclofen (LIORESAL) 10 MG tablet Take 10-20 mg by mouth 2 (two) times daily. Take 10 mg by mouth in the morning and 20 mg by mouth at bedtime.    . bisacodyl (DULCOLAX) 10 MG suppository Place 10 mg rectally daily.    . cefTRIAXone (ROCEPHIN) 1 g injection Inject 1 g into the  muscle daily.    . clarithromycin (BIAXIN) 500 MG tablet Take 500 mg by mouth 2 (two) times daily.    . diphenhydrAMINE (BENADRYL) 25 MG tablet Take 25 mg by mouth every 8 (eight) hours as needed for allergies. Administer via G-tube    . docusate (COLACE) 50 MG/5ML liquid Place 200 mg into feeding tube at bedtime.    Marland Kitchen doxepin (SINEQUAN) 10 MG capsule Place 10 mg into feeding tube at bedtime.     Marland Kitchen FLUoxetine (PROZAC) 20 MG/5ML solution Place 20 mg into feeding tube daily.     . furosemide (LASIX) 20 MG tablet Place 20 mg into feeding tube daily.     Marland Kitchen HYDROcodone-acetaminophen (NORCO) 10-325 MG  tablet Place 1 tablet into feeding tube every 6 (six) hours as needed for moderate pain.     Marland Kitchen ipratropium-albuterol (DUONEB) 0.5-2.5 (3) MG/3ML SOLN Take 3 mLs by nebulization every 4 (four) hours as needed (shortness of breath).     . loratadine (CLARITIN) 10 MG tablet Place 10 mg into feeding tube daily.    . magnesium hydroxide (MILK OF MAGNESIA) 400 MG/5ML suspension Place 30 mLs into feeding tube 2 (two) times daily as needed for mild constipation.     . Melatonin 3 MG TABS Place 1 tablet into feeding tube at bedtime.     . Misc Natural Products (OSTEO BI-FLEX ADV JOINT SHIELD PO) 1 tablet by PEG Tube route daily.     . Multiple Vitamin (MULTIVITAMIN) LIQD Place 15 mLs into feeding tube daily.     . pantoprazole sodium (PROTONIX) 40 mg/20 mL PACK Place 40 mg into feeding tube daily.    . polyethylene glycol (MIRALAX / GLYCOLAX) packet Place 17 g into feeding tube daily.     . sennosides (SENOKOT) 8.8 MG/5ML syrup Place 10 mLs into feeding tube at bedtime.    . shark liver oil-cocoa butter (PREPARATION H) 0.25-3-85.5 % suppository Place 1 suppository rectally every 12 (twelve) hours as needed (rectal bleeding).    . Nutritional Supplements (FEEDING SUPPLEMENT, JEVITY 1.5 CAL/FIBER,) LIQD Place 237 mLs into feeding tube 5 (five) times daily. (Patient not taking: Reported on 05/18/2018) 300 mL 0     Discharge Medications: Please see discharge summary for a list of discharge medications.  Relevant Imaging Results:  Relevant Lab Results:   Additional Information (SSN: [id number])  Emmaclaire Switala, Darleen Crocker, LCSW

## 2018-05-19 ENCOUNTER — Encounter: Payer: Self-pay | Admitting: *Deleted

## 2018-05-19 ENCOUNTER — Encounter
Admission: EM | Disposition: A | Discharge status: Skilled Nursing Facility | Payer: Self-pay | Source: Skilled Nursing Facility | Attending: Internal Medicine

## 2018-05-19 ENCOUNTER — Inpatient Hospital Stay: Payer: Medicaid Other | Admitting: Anesthesiology

## 2018-05-19 DIAGNOSIS — R195 Other fecal abnormalities: Secondary | ICD-10-CM

## 2018-05-19 DIAGNOSIS — D500 Iron deficiency anemia secondary to blood loss (chronic): Secondary | ICD-10-CM

## 2018-05-19 DIAGNOSIS — K3189 Other diseases of stomach and duodenum: Secondary | ICD-10-CM

## 2018-05-19 HISTORY — PX: ESOPHAGOGASTRODUODENOSCOPY: SHX5428

## 2018-05-19 LAB — BASIC METABOLIC PANEL
ANION GAP: 8 (ref 5–15)
BUN: 48 mg/dL — AB (ref 6–20)
CALCIUM: 7.9 mg/dL — AB (ref 8.9–10.3)
CO2: 22 mmol/L (ref 22–32)
Chloride: 110 mmol/L (ref 101–111)
Creatinine, Ser: 0.96 mg/dL (ref 0.61–1.24)
GFR calc Af Amer: >60 mL/min (ref >60)
GLUCOSE: 92 mg/dL (ref 65–99)
Potassium: 4.2 mmol/L (ref 3.5–5.1)
Sodium: 140 mmol/L (ref 135–145)

## 2018-05-19 LAB — FOLATE: FOLATE: 64 ng/mL (ref >5.9)

## 2018-05-19 LAB — FERRITIN: Ferritin: 19 ng/mL — ABNORMAL LOW (ref 24–336)

## 2018-05-19 LAB — IRON AND TIBC
Iron: 66 ug/dL (ref 45–182)
SATURATION RATIOS: 22 % (ref 17.9–39.5)
TIBC: 306 ug/dL (ref 250–450)
UIBC: 240 ug/dL

## 2018-05-19 LAB — VITAMIN B12: Vitamin B-12: 810 pg/mL (ref 180–914)

## 2018-05-19 LAB — MRSA PCR SCREENING: MRSA BY PCR: POSITIVE — AB

## 2018-05-19 LAB — HEMOGLOBIN AND HEMATOCRIT, BLOOD
HCT: 22.4 % — ABNORMAL LOW (ref 40.0–52.0)
Hemoglobin: 7.5 g/dL — ABNORMAL LOW (ref 13.0–18.0)

## 2018-05-19 SURGERY — EGD (ESOPHAGOGASTRODUODENOSCOPY)
Anesthesia: General

## 2018-05-19 MED ORDER — SODIUM CHLORIDE 0.9 % IV SOLN
510.0000 mg | Freq: Once | INTRAVENOUS | Status: AC
  Administered 2018-05-19: 510 mg via INTRAVENOUS
  Filled 2018-05-19: qty 17

## 2018-05-19 MED ORDER — DIPHENHYDRAMINE HCL 12.5 MG/5ML PO ELIX: 25.0000 mg | ORAL_SOLUTION | Freq: Three times a day (TID) | ORAL | 0 refills | Status: DC | PRN

## 2018-05-19 MED ORDER — JEVITY 1.2 CAL PO LIQD
1000.0000 mL | ORAL | Status: DC
  Administered 2018-05-19: 1000 mL

## 2018-05-19 MED ORDER — PANTOPRAZOLE SODIUM 40 MG PO PACK: 40.0000 mg | PACK | Freq: Two times a day (BID) | ORAL | Status: AC

## 2018-05-19 MED ORDER — PROPOFOL 500 MG/50ML IV EMUL
INTRAVENOUS | Status: DC | PRN
  Administered 2018-05-19: 75 ug/kg/min via INTRAVENOUS

## 2018-05-19 MED ORDER — CEFTRIAXONE SODIUM 1 G IJ SOLR: 1.0000 g | INTRAMUSCULAR | 0 refills | Status: AC

## 2018-05-19 MED ORDER — CLARITHROMYCIN 500 MG PO TABS: 500.0000 mg | ORAL_TABLET | Freq: Two times a day (BID) | ORAL | 0 refills | Status: AC

## 2018-05-19 MED ORDER — PROPOFOL 10 MG/ML IV BOLUS
INTRAVENOUS | Status: DC | PRN
  Administered 2018-05-19: 50 mg via INTRAVENOUS
  Administered 2018-05-19: 20 mg via INTRAVENOUS

## 2018-05-19 MED ORDER — LIDOCAINE HCL (CARDIAC) PF 100 MG/5ML IV SOSY
PREFILLED_SYRINGE | INTRAVENOUS | Status: DC | PRN
  Administered 2018-05-19: 60 mg via INTRAVENOUS

## 2018-05-19 MED ORDER — MUPIROCIN 2 % EX OINT
1.0000 "application " | TOPICAL_OINTMENT | Freq: Two times a day (BID) | CUTANEOUS | Status: DC
  Administered 2018-05-19: 1 via NASAL
  Filled 2018-05-19: qty 22

## 2018-05-19 MED ORDER — FERROUS SULFATE 5 MG/20ML PO LIQD: 325.0000 mg | Freq: Three times a day (TID) | ORAL | 10 refills | Status: AC

## 2018-05-19 MED ORDER — BACLOFEN 10 MG PO TABS: 10.0000 mg | ORAL_TABLET | Freq: Two times a day (BID) | ORAL | 0 refills | Status: DC

## 2018-05-19 MED ORDER — MIDAZOLAM HCL 2 MG/2ML IJ SOLN
INTRAMUSCULAR | Status: DC | PRN
  Administered 2018-05-19: 2 mg via INTRAVENOUS

## 2018-05-19 MED ORDER — MIDAZOLAM HCL 2 MG/2ML IJ SOLN
INTRAMUSCULAR | Status: AC
  Filled 2018-05-19: qty 2

## 2018-05-19 MED ORDER — ALPRAZOLAM 0.5 MG PO TABS: 0.5000 mg | ORAL_TABLET | Freq: Every day | ORAL | 0 refills | Status: DC

## 2018-05-19 MED ORDER — HYDROCODONE-ACETAMINOPHEN 10-325 MG PO TABS: 1.0000 | ORAL_TABLET | Freq: Four times a day (QID) | ORAL | 0 refills | Status: DC | PRN

## 2018-05-19 MED ORDER — JEVITY 1.2 CAL PO LIQD: 1000.0000 mL | ORAL | 3 refills | Status: DC

## 2018-05-19 MED ORDER — CHLORHEXIDINE GLUCONATE CLOTH 2 % EX PADS: 6.0000 | MEDICATED_PAD | Freq: Every day | CUTANEOUS | Status: DC

## 2018-05-19 MED ORDER — MUPIROCIN 2 % EX OINT: 1.0000 "application " | TOPICAL_OINTMENT | Freq: Two times a day (BID) | CUTANEOUS | 0 refills | Status: AC

## 2018-05-19 MED ORDER — LIDOCAINE HCL (PF) 2 % IJ SOLN
INTRAMUSCULAR | Status: AC
  Filled 2018-05-19: qty 10

## 2018-05-19 NOTE — Anesthesia Postprocedure Evaluation (Signed)
Anesthesia Post Note  Patient: Alexander Escobar  Procedure(s) Performed: ESOPHAGOGASTRODUODENOSCOPY (EGD) (N/A )  Patient location during evaluation: PACU Anesthesia Type: General Level of consciousness: awake and alert Pain management: pain level controlled Vital Signs Assessment: post-procedure vital signs reviewed and stable Respiratory status: spontaneous breathing, nonlabored ventilation, respiratory function stable and patient connected to nasal cannula oxygen Cardiovascular status: blood pressure returned to baseline and stable Postop Assessment: no apparent nausea or vomiting Anesthetic complications: no     Last Vitals:  Vitals:   05/19/18 1156 05/19/18 1205  BP: (!) 93/46 (!) 101/57  Pulse: 75 82  Resp: 18 18  Temp: 37.1 C   SpO2: 99% 99%    Last Pain:  Vitals:   05/19/18 1205  TempSrc:   PainSc: 6                  Cleda MccreedyJoseph K Kenzee Bassin

## 2018-05-19 NOTE — Progress Notes (Signed)
Per MD okay for RN to place Discharge order.

## 2018-05-19 NOTE — Clinical Social Work Note (Signed)
CSW received verbal notification that the patient will discharge, today. The CSW will deliver the discharge packet once the discharge summary is available. CSW is following.  Argentina PonderKaren Martha Talaya Lamprecht, MSW, Theresia MajorsLCSWA [phone]

## 2018-05-19 NOTE — Op Note (Signed)
[facility] Gastroenterology Patient Name: Alexander Escobar Procedure Date: 05/19/2018 11:14 AM MRN: [id number] Account #: [id number] Date of Birth: 1961-02-28 Admit Type: Inpatient Age: 57 Room: [facility] ENDO ROOM 4 Gender: Male Note Status: Finalized Procedure:            Upper GI endoscopy Indications:          Iron deficiency anemia secondary to chronic blood loss,                        Suspected upper gastrointestinal bleeding in patient                        with unexplained iron deficiency anemia, dark stools Providers:            Lin Landsman MD, MD Referring MD:         Maryella Shivers (Referring MD) Medicines:            Monitored Anesthesia Care Complications:        No immediate complications. Estimated blood loss:                        Minimal. Procedure:            Pre-Anesthesia Assessment:                       - Prior to the procedure, a History and Physical was                        performed, and patient medications and allergies were                        reviewed. The patient is competent. The risks and                        benefits of the procedure and the sedation options and                        risks were discussed with the patient. All questions                        were answered and informed consent was obtained.                        Patient identification and proposed procedure were                        verified by the physician, the nurse, the                        anesthesiologist, the anesthetist and the technician in                        the pre-procedure area in the procedure room in the                        endoscopy suite. Mental Status Examination: alert and                        oriented. Airway Examination: normal oropharyngeal  airway and neck mobility. Respiratory Examination:                        clear to auscultation. CV Examination: normal.                        Prophylactic  Antibiotics: The patient does not require                        prophylactic antibiotics. Prior Anticoagulants: The                        patient has taken no previous anticoagulant or                        antiplatelet agents. ASA Grade Assessment: IV - A                        patient with severe systemic disease that is a constant                        threat to life. After reviewing the risks and benefits,                        the patient was deemed in satisfactory condition to                        undergo the procedure. The anesthesia plan was to use                        monitored anesthesia care (MAC). Immediately prior to                        administration of medications, the patient was                        re-assessed for adequacy to receive sedatives. The                        heart rate, respiratory rate, oxygen saturations, blood                        pressure, adequacy of pulmonary ventilation, and                        response to care were monitored throughout the                        procedure. The physical status of the patient was                        re-assessed after the procedure.                       After obtaining informed consent, the endoscope was                        passed under direct vision. Throughout the procedure,  the patient's blood pressure, pulse, and oxygen                        saturations were monitored continuously. The Endoscope                        was introduced through the mouth, and advanced to the                        second part of duodenum. The upper GI endoscopy was                        accomplished without difficulty. The patient tolerated                        the procedure well. Findings:      Diffuse moderately erythematous mucosa without active bleeding and with       no stigmata of bleeding was found in the duodenal bulb.      The second portion of the duodenum was normal.       Diffuse moderately erythematous mucosa with bleeding was found in the       gastric antrum. Biopsies were taken with a cold forceps for Helicobacter       pylori testing.      The gastric fundus and gastric body were normal. Biopsies were taken       with a cold forceps for Helicobacter pylori testing.      The cardia and gastric fundus were normal on retroflexion.      The gastroesophageal junction and examined esophagus were normal.      There was evidence of an intact gastrostomy with a patent G-tube present       on the anterior wall of the gastric body. This was characterized by       healthy appearing mucosa. Impression:           - Erythematous duodenopathy.                       - Normal second portion of the duodenum.                       - Erythematous mucosa in the antrum. Biopsied.                       - Normal gastric fundus and gastric body. Biopsied.                       - Normal gastroesophageal junction and esophagus. Recommendation:       - Return patient to hospital ward for possible                        discharge same day.                       - Continue present medications.                       - Await pathology results.                       - Resume tube feeds via PEG today.                       -  Start liquid iron via PEG 370m TID for 341monthProcedure Code(s):    --- Professional ---                       [id number]py, flexible, transoral;                        with biopsy, single or multiple Diagnosis Code(s):    --- Professional ---                       K31.89, Other diseases of stomach and duodenum                       D50.0, Iron deficiency anemia secondary to blood loss                        (chronic)                       D50.9, Iron deficiency anemia, unspecified CPT copyright 2017 American Medical Association. All rights reserved. The codes documented in this report are preliminary and upon coder review may  be revised  to meet current compliance requirements. Dr. RoUlyess MortoLin LandsmanD, MD 05/19/2018 11:36:04 AM This report has been signed electronically. Number of Addenda: 0 Note Initiated On: 05/19/2018 11:14 AM      [facility]

## 2018-05-19 NOTE — Anesthesia Procedure Notes (Signed)
Performed by: Breia Ocampo, CRNA Pre-anesthesia Checklist: Patient identified, Emergency Drugs available, Suction available, Patient being monitored and Timeout performed Patient Re-evaluated:Patient Re-evaluated prior to induction Oxygen Delivery Method: Nasal cannula Induction Type: IV induction       

## 2018-05-19 NOTE — Transfer of Care (Signed)
Immediate Anesthesia Transfer of Care Note  Patient: Alexander CofferJames Escobar  Procedure(s) Performed: ESOPHAGOGASTRODUODENOSCOPY (EGD) (N/A )  Patient Location: PACU  Anesthesia Type:General  Level of Consciousness: sedated  Airway & Oxygen Therapy: Patient Spontanous Breathing and Patient connected to nasal cannula oxygen  Post-op Assessment: Report given to RN and Post -op Vital signs reviewed and stable  Post vital signs: Reviewed and stable  Last Vitals:  Vitals Value Taken Time  BP 94/47 05/19/2018 11:35 AM  Temp    Pulse 81 05/19/2018 11:35 AM  Resp 15 05/19/2018 11:35 AM  SpO2 99 % 05/19/2018 11:35 AM  Vitals shown include unvalidated device data.  Last Pain:  Vitals:   05/19/18 0954  TempSrc:   PainSc: 7       Patients Stated Pain Goal: 0 (05/19/18 0954)  Complications: No apparent anesthesia complications

## 2018-05-19 NOTE — Progress Notes (Signed)
Pt. Refused CBG's.

## 2018-05-19 NOTE — Anesthesia Post-op Follow-up Note (Signed)
Anesthesia QCDR form completed.        

## 2018-05-19 NOTE — Clinical Social Work Note (Signed)
CSW received consult that this patient admitted from a SNF. According to the chart notes, the patient is already being followed by social work and was assessed in the ED. CSW is already following.  Argentina PonderKaren Martha Quinette Hentges, MSW, Theresia MajorsLCSWA [phone]

## 2018-05-19 NOTE — Progress Notes (Signed)
Call report to [facility]. [facility] L[facility].

## 2018-05-19 NOTE — Progress Notes (Signed)
Pt refusing CBG  Checks. Provided education to pt and he still refuse.

## 2018-05-19 NOTE — Anesthesia Preprocedure Evaluation (Signed)
Anesthesia Evaluation  Patient identified by MRN, date of birth, ID band Patient awake    Reviewed: Allergy & Precautions, H&P , NPO status , Patient's Chart, lab work & pertinent test results  History of Anesthesia Complications Negative for: history of anesthetic complications  Airway Mallampati: III  TM Distance: >3 FB Neck ROM: full    Dental  (+) Chipped, Poor Dentition   Pulmonary neg pulmonary ROS, neg shortness of breath, pneumonia, former smoker,           Cardiovascular (-) angina(-) Past MI + pacemaker ([employer])   Patient says his BP normal drops to 80s/40s   Neuro/Psych PSYCHIATRIC DISORDERS Depression negative neurological ROS     GI/Hepatic Neg liver ROS, GERD  Medicated and Controlled,  Endo/Other  negative endocrine ROS  Renal/GU CRFRenal disease  negative genitourinary   Musculoskeletal   Abdominal   Peds  Hematology negative hematology ROS (+)   Anesthesia Other Findings Past Medical History: No date: Congenital absence of external auditory canal No date: Gastroesophageal reflux disease No date: Presence of permanent cardiac pacemaker No date: Quadriplegia (HCC) No date: Recurrent major depression (HCC) No date: Severe protein-calorie malnutrition (HCC)  Past Surgical History: No date: PEG TUBE PLACEMENT No date: SUPRAPUBIC CATHETER INSERTION  BMI    Body Mass Index:  24.96 kg/m      Reproductive/Obstetrics negative OB ROS                             Anesthesia Physical Anesthesia Plan  ASA: IV  Anesthesia Plan: General   Post-op Pain Management:    Induction: Intravenous  PONV Risk Score and Plan: Propofol infusion and TIVA  Airway Management Planned: Natural Airway and Nasal Cannula  Additional Equipment:   Intra-op Plan:   Post-operative Plan:   Informed Consent: I have reviewed the patients History and Physical, chart, labs and discussed the  procedure including the risks, benefits and alternatives for the proposed anesthesia with the patient or authorized representative who has indicated his/her understanding and acceptance.   Dental Advisory Given  Plan Discussed with: Anesthesiologist, CRNA and Surgeon  Anesthesia Plan Comments: (Patient consented for risks of anesthesia including but not limited to:  - adverse reactions to medications - risk of intubation if required - damage to teeth, lips or other oral mucosa - sore throat or hoarseness - Damage to heart, brain, lungs or loss of life  Patient voiced understanding.)        Anesthesia Quick Evaluation

## 2018-05-19 NOTE — Clinical Social Work Note (Signed)
The patient will discharge to [facility] today via non-emergent EMS. The patient, his Allie BossierHCPOA Cheryl, and the facility are aware and in agreement. The CSW has delivered the discharge packet and sent all needed discharge paperwork to the facility. The CSW is signing off. Please consult should needs arise.  Argentina PonderKaren Martha Seamus Warehime, MSW, Theresia MajorsLCSWA [phone]

## 2018-05-19 NOTE — Progress Notes (Signed)
Deanne CofferJames Bilyk  A and O x 4. VSS. Pt tolerating diet well. No complaints of pain or nausea. IV removed intact, prescriptions given to pt and POA. Pt and POA voiced understanding of discharge instructions with no further questions. Pt discharged via EMS to [facility].    Allergies as of 05/19/2018      Reactions   Ciprofloxacin Swelling   Penicillins Rash   Has patient had a PCN reaction causing immediate rash, facial/tongue/throat swelling, SOB or lightheadedness with hypotension: Unknown Has patient had a PCN reaction causing severe rash involving mucus membranes or skin necrosis: Unknown Has patient had a PCN reaction that required hospitalization: Unknown Has patient had a PCN reaction occurring within the last 10 years: Unknown If all of the above answers are "NO", then may proceed with Cephalosporin use.      Medication List    STOP taking these medications   diphenhydrAMINE 25 MG tablet Commonly known as:  BENADRYL Replaced by:  diphenhydrAMINE 12.5 MG/5ML elixir     TAKE these medications   ALPRAZolam 0.5 MG tablet Commonly known as:  XANAX Place 1 tablet (0.5 mg total) into feeding tube at bedtime.   ascorbic acid 500 MG tablet Commonly known as:  VITAMIN C Place 500 mg into feeding tube daily.   baclofen 10 MG tablet Commonly known as:  LIORESAL Place 1-2 tablets (10-20 mg total) into feeding tube 2 (two) times daily. Take 10 mg by mouth in the morning and 20 mg by mouth at bedtime. What changed:  how to take this   bisacodyl 10 MG suppository Commonly known as:  DULCOLAX Place 10 mg rectally daily.   cefTRIAXone 1 g injection Commonly known as:  ROCEPHIN Inject 1 g into the muscle daily for 8 days. What changed:  when to take this   clarithromycin 500 MG tablet Commonly known as:  BIAXIN Take 1 tablet (500 mg total) by mouth 2 (two) times daily for 8 days.   diphenhydrAMINE 12.5 MG/5ML elixir Commonly known as:  BENADRYL Place 10 mLs (25 mg total)  into feeding tube every 8 (eight) hours as needed for allergies. Replaces:  diphenhydrAMINE 25 MG tablet   docusate 50 MG/5ML liquid Commonly known as:  COLACE Place 200 mg into feeding tube at bedtime.   doxepin 10 MG capsule Commonly known as:  SINEQUAN Place 10 mg into feeding tube at bedtime.   feeding supplement (JEVITY 1.2 CAL) Liqd Place 1,000 mLs into feeding tube continuous. What changed:    how much to take  when to take this   feeding supplement (PRO-STAT SUGAR FREE 64) Liqd Place 30 mLs into feeding tube 3 (three) times daily. What changed:  when to take this   Ferrous Sulfate 5 MG/20ML Liqd 325 mg by PEG Tube route 3 (three) times daily with meals.   FLUoxetine 20 MG/5ML solution Commonly known as:  PROZAC Place 20 mg into feeding tube daily.   furosemide 20 MG tablet Commonly known as:  LASIX Place 20 mg into feeding tube daily.   HYDROcodone-acetaminophen 10-325 MG tablet Commonly known as:  NORCO Place 1 tablet into feeding tube every 6 (six) hours as needed for moderate pain.   ipratropium-albuterol 0.5-2.5 (3) MG/3ML Soln Commonly known as:  DUONEB Take 3 mLs by nebulization every 4 (four) hours as needed (shortness of breath).   loratadine 10 MG tablet Commonly known as:  CLARITIN Place 10 mg into feeding tube daily.   magnesium hydroxide 400 MG/5ML suspension Commonly known  as:  MILK OF MAGNESIA Place 30 mLs into feeding tube 2 (two) times daily as needed for mild constipation.   Melatonin 3 MG Tabs Place 1 tablet into feeding tube at bedtime.   multivitamin Liqd Place 15 mLs into feeding tube daily.   mupirocin ointment 2 % Commonly known as:  BACTROBAN Place 1 application into the nose 2 (two) times daily for 5 days.   OSTEO BI-FLEX ADV JOINT SHIELD PO 1 tablet by PEG Tube route daily.   pantoprazole sodium 40 mg/20 mL Pack Commonly known as:  PROTONIX Place 20 mLs (40 mg total) into feeding tube 2 (two) times daily. What  changed:  when to take this   polyethylene glycol packet Commonly known as:  MIRALAX / GLYCOLAX Place 17 g into feeding tube daily.   sennosides 8.8 MG/5ML syrup Commonly known as:  SENOKOT Place 10 mLs into feeding tube at bedtime.   shark liver oil-cocoa butter 0.25-3-85.5 % suppository Commonly known as:  PREPARATION H Place 1 suppository rectally every 12 (twelve) hours as needed (rectal bleeding).       Vitals:   05/19/18 1252 05/19/18 1453  BP: 127/70 (!) 146/73  Pulse: 78 70  Resp: 17   Temp: 98.3 F (36.8 C) 98.2 F (36.8 C)  SpO2: 97% 98%    Alexander Escobar

## 2018-05-19 NOTE — Progress Notes (Signed)
Per MD okay for RN to resume previous order for tube feedings.

## 2018-05-19 NOTE — Discharge Instructions (Signed)
Follow-up  with primary care physician at the facility in 3 to 5 days Follow-up with gastroenterology in 3 to 4 weeks
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
## 2018-05-20 ENCOUNTER — Encounter: Payer: Self-pay | Admitting: Gastroenterology

## 2018-05-20 LAB — BPAM RBC
BLOOD PRODUCT EXPIRATION DATE: 201906062359
Blood Product Expiration Date: 201906062359
ISSUE DATE / TIME: 201906011656
Unit Type and Rh: 6200
Unit Type and Rh: 6200

## 2018-05-20 LAB — TYPE AND SCREEN
ABO/RH(D): A POS
ANTIBODY SCREEN: NEGATIVE
UNIT DIVISION: 0
Unit division: 0

## 2018-05-21 LAB — SURGICAL PATHOLOGY

## 2018-06-17 ENCOUNTER — Ambulatory Visit: Payer: Medicaid Other | Admitting: Gastroenterology

## 2018-06-17 ENCOUNTER — Ambulatory Visit
Admission: RE | Admit: 2018-06-17 | Discharge: 2018-06-17 | Disposition: A | Discharge status: Home / Self Care | Payer: Medicaid Other | Source: Ambulatory Visit | Attending: Physician Assistant | Admitting: Physician Assistant

## 2018-06-17 DIAGNOSIS — D649 Anemia, unspecified: Secondary | ICD-10-CM | POA: Insufficient documentation

## 2018-06-17 LAB — PREPARE RBC (CROSSMATCH)

## 2018-06-17 LAB — POCT I-STAT 4, (NA,K, GLUC, HGB,HCT)
Glucose, Bld: 84 mg/dL (ref 70–99)
HCT: 28 % — ABNORMAL LOW (ref 39.0–52.0)
HEMOGLOBIN: 9.5 g/dL — AB (ref 13.0–17.0)
Potassium: 4.6 mmol/L (ref 3.5–5.1)
Sodium: 139 mmol/L (ref 135–145)

## 2018-06-17 LAB — HEMOGLOBIN AND HEMATOCRIT, BLOOD
HCT: 23.2 % — ABNORMAL LOW (ref 40.0–52.0)
Hemoglobin: 7 g/dL — ABNORMAL LOW (ref 13.0–18.0)

## 2018-06-17 LAB — HEMOGLOBIN: HEMOGLOBIN: 8.1 g/dL — AB (ref 13.0–18.0)

## 2018-06-17 MED ORDER — SODIUM CHLORIDE 0.9 % IV SOLN: INTRAVENOUS | Status: DC

## 2018-06-17 MED ORDER — SODIUM CHLORIDE 0.9% IV SOLUTION: Freq: Once | INTRAVENOUS | Status: DC

## 2018-06-18 ENCOUNTER — Ambulatory Visit: Payer: Medicaid Other | Admitting: Gastroenterology

## 2018-06-18 ENCOUNTER — Encounter: Payer: Self-pay | Admitting: Gastroenterology

## 2018-06-18 ENCOUNTER — Encounter: Payer: Self-pay | Admitting: *Deleted

## 2018-06-18 ENCOUNTER — Ambulatory Visit (INDEPENDENT_AMBULATORY_CARE_PROVIDER_SITE_OTHER): Payer: Medicaid Other | Admitting: Gastroenterology

## 2018-06-18 VITALS — BP 95/62 | HR 60

## 2018-06-18 DIAGNOSIS — D509 Iron deficiency anemia, unspecified: Secondary | ICD-10-CM

## 2018-06-18 LAB — TYPE AND SCREEN
ABO/RH(D): A POS
Antibody Screen: NEGATIVE
UNIT DIVISION: 0
UNIT DIVISION: 0

## 2018-06-18 LAB — BPAM RBC
Blood Product Expiration Date: 201907162359
Blood Product Expiration Date: 201907162359
ISSUE DATE / TIME: 201907010913
ISSUE DATE / TIME: 201907011413
UNIT TYPE AND RH: 6200
Unit Type and Rh: 6200
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
## 2018-07-05 ENCOUNTER — Ambulatory Visit: Payer: Medicaid Other | Admitting: Gastroenterology

## 2018-07-21 ENCOUNTER — Emergency Department: Payer: Medicaid Other

## 2018-07-21 ENCOUNTER — Emergency Department
Admission: EM | Admit: 2018-07-21 | Discharge: 2018-07-21 | Payer: Medicaid Other | Attending: Emergency Medicine | Admitting: Emergency Medicine

## 2018-07-21 ENCOUNTER — Emergency Department
Admission: EM | Admit: 2018-07-21 | Discharge: 2018-07-21 | Disposition: A | Discharge status: Skilled Nursing Facility | Payer: Medicaid Other | Source: Home / Self Care | Attending: Student in an Organized Health Care Education/Training Program | Admitting: Student in an Organized Health Care Education/Training Program

## 2018-07-21 ENCOUNTER — Other Ambulatory Visit: Payer: Self-pay

## 2018-07-21 DIAGNOSIS — G8250 Quadriplegia, unspecified: Secondary | ICD-10-CM | POA: Diagnosis not present

## 2018-07-21 DIAGNOSIS — Z87891 Personal history of nicotine dependence: Secondary | ICD-10-CM | POA: Insufficient documentation

## 2018-07-21 DIAGNOSIS — Z931 Gastrostomy status: Secondary | ICD-10-CM

## 2018-07-21 DIAGNOSIS — Z434 Encounter for attention to other artificial openings of digestive tract: Secondary | ICD-10-CM | POA: Diagnosis not present

## 2018-07-21 DIAGNOSIS — E43 Unspecified severe protein-calorie malnutrition: Secondary | ICD-10-CM | POA: Diagnosis not present

## 2018-07-21 DIAGNOSIS — Z79899 Other long term (current) drug therapy: Secondary | ICD-10-CM | POA: Insufficient documentation

## 2018-07-21 DIAGNOSIS — T85598A Other mechanical complication of other gastrointestinal prosthetic devices, implants and grafts, initial encounter: Secondary | ICD-10-CM

## 2018-07-21 MED ORDER — DIATRIZOATE MEGLUMINE & SODIUM 66-10 % PO SOLN: 30.0000 mL | Freq: Once | ORAL | Status: DC

## 2018-07-21 MED ORDER — DIATRIZOATE MEGLUMINE & SODIUM 66-10 % PO SOLN
30.0000 mL | Freq: Once | ORAL | Status: AC
  Administered 2018-07-21: 30 mL via JEJUNOSTOMY

## 2018-07-21 NOTE — ED Notes (Signed)
[facility]

## 2018-07-21 NOTE — Discharge Instructions (Addendum)
Please follow Up with PCP.  Return for any additional questions or concerns.  This includes persistent fevers, inability to tolerate feeds or antibiotics.  In order to help reduce drainage from around the G-tube make sure that the feeding tube is pulled snugly to form a tight seal with the balloon against the gastric wall.

## 2018-07-21 NOTE — Discharge Instructions (Addendum)
The x-ray has verified that your PEG tube is in the proper position. Return to the ER for recurrent or worsening symptoms, persistent vomiting, difficulty breathing or any concerns.

## 2018-07-21 NOTE — ED Notes (Signed)
EMS has arrived to transport pt back to [facility]

## 2018-07-21 NOTE — ED Notes (Signed)
Pt reports starting levaquin 2x daily yesterday for pneumonia and UTI. Dr. Roxan Hockeyobinson aware.

## 2018-07-21 NOTE — ED Provider Notes (Signed)
[facility] Emergency Department Provider Note    First MD Initiated Contact with Patient 07/21/18 1919     (approximate)  I have reviewed the triage vital signs and the nursing notes.   HISTORY  Chief Complaint No chief complaint on file.    HPI Alexander Escobar is a 57 y.o. male for displacement of feeding tube.  Patient was seen here last night for similar symptoms.  Went back, reportedly the cuff had spontaneously deflated and fell out.  They replaced it with another 22 [place] feeding tube and sent to the ER for evaluation because there is still some leaking around the G-tube.  Past Medical History:  Diagnosis Date  . Congenital absence of external auditory canal   . Gastroesophageal reflux disease   . Presence of permanent cardiac pacemaker   . Quadriplegia (HCC)   . Recurrent major depression (HCC)   . Severe protein-calorie malnutrition (HCC)    Family History  Problem Relation Age of Onset  . CAD Mother   . CAD Father    Past Surgical History:  Procedure Laterality Date  . ESOPHAGOGASTRODUODENOSCOPY N/A 05/19/2018   Procedure: ESOPHAGOGASTRODUODENOSCOPY (EGD);  Surgeon: Toney Reil, MD;  Location: [facility] ENDOSCOPY;  Service: Gastroenterology;  Laterality: N/A;  . PEG TUBE PLACEMENT    . SUPRAPUBIC CATHETER INSERTION     Patient Active Problem List   Diagnosis Date Noted  . Iron deficiency anemia due to chronic blood loss   . Dark stools   . GI bleed 05/18/2018  . Pressure injury of skin 07/04/2017  . Sepsis (HCC) 07/03/2017  . UTI (urinary tract infection) due to urinary indwelling Foley catheter (HCC) 07/03/2017  . Pneumonia 07/03/2017      Prior to Admission medications   Medication Sig Start Date End Date Taking? Authorizing Provider  ALPRAZolam Prudy Feeler) 0.5 MG tablet Place 1 tablet (0.5 mg total) into feeding tube at bedtime. 05/19/18   Gouru, Deanna Artis, MD  Amino Acids-Protein Hydrolys (FEEDING SUPPLEMENT, PRO-STAT SUGAR FREE  64,) LIQD Place 30 mLs into feeding tube 3 (three) times daily. Patient taking differently: Place 30 mLs into feeding tube daily.  07/08/17   Enedina Finner, MD  ascorbic acid (VITAMIN C) 500 MG tablet Place 500 mg into feeding tube daily.    [provider]  baclofen (LIORESAL) 10 MG tablet Place 1-2 tablets (10-20 mg total) into feeding tube 2 (two) times daily. Take 10 mg by mouth in the morning and 20 mg by mouth at bedtime. 05/19/18   Gouru, Deanna Artis, MD  bisacodyl (DULCOLAX) 10 MG suppository Place 10 mg rectally daily.    [provider]  diphenhydrAMINE (BENADRYL) 12.5 MG/5ML elixir Place 10 mLs (25 mg total) into feeding tube every 8 (eight) hours as needed for allergies. 05/19/18   Gouru, Deanna Artis, MD  docusate (COLACE) 50 MG/5ML liquid Place 200 mg into feeding tube at bedtime.    [provider]  doxepin (SINEQUAN) 10 MG capsule Place 10 mg into feeding tube at bedtime.     [provider]  Ferrous Sulfate 5 MG/20ML LIQD 325 mg by PEG Tube route 3 (three) times daily with meals. 05/19/18 08/20/18  Ramonita Lab, MD  FLUoxetine (PROZAC) 20 MG/5ML solution Place 20 mg into feeding tube daily.     [provider]  furosemide (LASIX) 20 MG tablet Place 20 mg into feeding tube daily.     [provider]  HYDROcodone-acetaminophen (NORCO) 10-325 MG tablet Place 1 tablet into feeding tube every 6 (  six) hours as needed for moderate pain. 05/19/18   Gouru, Deanna Artis, MD  ipratropium-albuterol (DUONEB) 0.5-2.5 (3) MG/3ML SOLN Take 3 mLs by nebulization every 4 (four) hours as needed (shortness of breath).     [provider]  loratadine (CLARITIN) 10 MG tablet Place 10 mg into feeding tube daily.    [provider]  magnesium hydroxide (MILK OF MAGNESIA) 400 MG/5ML suspension Place 30 mLs into feeding tube 2 (two) times daily as needed for mild constipation.     [provider]  Melatonin 3 MG TABS Place 1 tablet into feeding tube at bedtime.      [provider]  Misc Natural Products (OSTEO BI-FLEX ADV JOINT SHIELD PO) 1 tablet by PEG Tube route daily.     [provider]  Multiple Vitamin (MULTIVITAMIN) LIQD Place 15 mLs into feeding tube daily.     [provider]  Nutritional Supplements (FEEDING SUPPLEMENT, JEVITY 1.2 CAL,) LIQD Place 1,000 mLs into feeding tube continuous. 05/19/18   Ramonita Lab, MD  pantoprazole sodium (PROTONIX) 40 mg/20 mL PACK Place 20 mLs (40 mg total) into feeding tube 2 (two) times daily. 05/19/18   Gouru, Deanna Artis, MD  polyethylene glycol (MIRALAX / GLYCOLAX) packet Place 17 g into feeding tube daily.     [provider]  sennosides (SENOKOT) 8.8 MG/5ML syrup Place 10 mLs into feeding tube at bedtime.    [provider]  shark liver oil-cocoa butter (PREPARATION H) 0.25-3-85.5 % suppository Place 1 suppository rectally every 12 (twelve) hours as needed (rectal bleeding).    [provider]    Allergies Ciprofloxacin and Penicillins    Social History Social History   Tobacco Use  . Smoking status: Former Games developer  . Smokeless tobacco: Never Used  Substance Use Topics  . Alcohol use: No  . Drug use: No    Review of Systems Patient denies headaches, rhinorrhea, blurry vision, numbness, shortness of breath, chest pain, edema, cough, abdominal pain, nausea, vomiting, diarrhea, dysuria, fevers, rashes or hallucinations unless otherwise stated above in HPI. ____________________________________________   PHYSICAL EXAM:  VITAL SIGNS: Vitals:   07/21/18 1923 07/21/18 2006  BP: 119/73   Pulse: 95   Resp: 16   Temp: 100.1 F (37.8 C) 99.2 F (37.3 C)  SpO2: 97%     Constitutional: Alert and oriented. Well appearing and in no acute distress. Eyes: Conjunctivae are normal.  Head: Atraumatic. Nose: No congestion/rhinnorhea. Mouth/Throat: Mucous membranes are moist.   Neck: Painless ROM.  Cardiovascular:   Good peripheral  circulation. Respiratory: Normal respiratory effort.  No retractions.  Gastrointestinal: Soft and nontender. G tube in appropriate position, scant amount of fluid draining Musculoskeletal: No lower extremity tenderness .  No joint effusions. Neurologic:  Normal speech and language. .  Skin:  Skin is warm, dry and intact. No rash noted. Psychiatric: Mood and affect are normal. Speech and behavior are normal.  ____________________________________________   LABS (all labs ordered are listed, but only abnormal results are displayed)  No results found for this or any previous visit (from the past 24 hour(s)). ____________________________________________  EKG____________________________________________  RADIOLOGY  I personally reviewed all radiographic images ordered to evaluate for the above acute complaints and reviewed radiology reports and findings.  These findings were personally discussed with the patient.  Please see medical record for radiology report.  ____________________________________________   PROCEDURES  Procedure(s) performed:  Procedures    Critical Care performed: no ____________________________________________   INITIAL IMPRESSION / ASSESSMENT AND PLAN / ED  COURSE  Pertinent labs & imaging results that were available during my care of the patient were reviewed by me and considered in my medical decision making (see chart for details).  DDX: g tube displacement, obstruction, pneumoperitoneum  Deanne CofferJames Dubow is a 57 y.o. who presents to the ED with displaced G-tube sent for evaluation of placement.  Noted to have mild temperature but the patient is currently on Levaquin for UTI and pneumonia and just started antibiotics.  States he otherwise feels fine and does not want any additional diagnostic testing at this point.  Repeat temperature does show a oral temp of 99.2.  Will order x-ray to confirm G-tube placement.  If in appropriate position, stable for dc back to  facility.  Abdominal exam soft and benign.  Have discussed with the patient and available family all diagnostics and treatments performed thus far and all questions were answered to the best of my ability. The patient demonstrates understanding and agreement with plan.       ____________________________________________   FINAL CLINICAL IMPRESSION(S) / ED DIAGNOSES  Final diagnoses:  Feeding tube dysfunction, initial encounter      NEW MEDICATIONS STARTED DURING THIS VISIT:  New Prescriptions   No medications on file     Note:  This document was prepared using Dragon voice recognition software and may include unintentional dictation errors.     Willy Eddyobinson, Nikolette Reindl, MD 07/21/18 2018

## 2018-07-21 NOTE — ED Notes (Signed)
PEG tube assessed by this RN. No fluid able to be withdrawn from bulb so per MD Sung 20 ml of NS inserted into bulb in order to secure devise.

## 2018-07-21 NOTE — ED Provider Notes (Signed)
[facility] Emergency Department Provider Note   ____________________________________________   First MD Initiated Contact with Patient 07/21/18 0209     (approximate)  I have reviewed the triage vital signs and the nursing notes.   HISTORY  Chief Complaint Feeding Tube Replacement    HPI Alexander Escobar is a 57 y.o. male sent to the ED from [facility] for evaluation of PEG tube placement.  Reportedly patient's tube was replaced today at the facility after it was clogged by medications.  An x-ray was ordered at the facility which was done without Gastrografin and thus the radiologist could not comment on its placement.  I spoke directly with the nurse at [facility] and she relayed this information to me.  Medicines were given through the tube.  Patient's POA states there was some leakage around the tube.  Patient states this is normal but POA states there was more leakage than usual.  Denies chest pain, shortness of breath.  Has chronic left shoulder pain.   Past Medical History:  Diagnosis Date  . Congenital absence of external auditory canal   . Gastroesophageal reflux disease   . Presence of permanent cardiac pacemaker   . Quadriplegia (HCC)   . Recurrent major depression (HCC)   . Severe protein-calorie malnutrition [facility](HCC)     Patient Active Problem List   Diagnosis Date Noted  . Iron deficiency anemia due to chronic blood loss   . Dark stools   . GI bleed 05/18/2018  . Pressure injury of skin 07/04/2017  . Sepsis (HCC) 07/03/2017  . UTI (urinary tract infection) due to urinary indwelling Foley catheter (HCC) 07/03/2017  . Pneumonia 07/03/2017    Past Surgical History:  Procedure Laterality Date  . ESOPHAGOGASTRODUODENOSCOPY N/A 05/19/2018   Procedure: ESOPHAGOGASTRODUODENOSCOPY (EGD);  Surgeon: Toney ReilVanga, Rohini Reddy, MD;  Location[facility] ENDOSCOPY;  Service: Gastroenterology;  Laterality: N/A;  . PEG TUBE PLACEMENT    .  SUPRAPUBIC CATHETER INSERTION      Prior to Admission medications   Medication Sig Start Date End Date Taking? Authorizing Provider  ALPRAZolam Prudy Feeler(XANAX) 0.5 MG tablet Place 1 tablet (0.5 mg total) into feeding tube at bedtime. 05/19/18   Gouru, Deanna ArtisAruna, MD  Amino Acids-Protein Hydrolys (FEEDING SUPPLEMENT, PRO-STAT SUGAR FREE 64,) LIQD Place 30 mLs into feeding tube 3 (three) times daily. Patient taking differently: Place 30 mLs into feeding tube daily.  07/08/17   Enedina FinnerPatel, Sona, MD  ascorbic acid (VITAMIN C) 500 MG tablet Place 500 mg into feeding tube daily.    [provider]  baclofen (LIORESAL) 10 MG tablet Place 1-2 tablets (10-20 mg total) into feeding tube 2 (two) times daily. Take 10 mg by mouth in the morning and 20 mg by mouth at bedtime. 05/19/18   Gouru, Deanna ArtisAruna, MD  bisacodyl (DULCOLAX) 10 MG suppository Place 10 mg rectally daily.    [provider]  diphenhydrAMINE (BENADRYL) 12.5 MG/5ML elixir Place 10 mLs (25 mg total) into feeding tube every 8 (eight) hours as needed for allergies. 05/19/18   Gouru, Deanna ArtisAruna, MD  docusate (COLACE) 50 MG/5ML liquid Place 200 mg into feeding tube at bedtime.    [provider]  doxepin (SINEQUAN) 10 MG capsule Place 10 mg into feeding tube at bedtime.     [provider]  Ferrous Sulfate 5 MG/20ML LIQD 325 mg by PEG Tube route 3 (three) times daily with meals. 05/19/18 08/20/18  Ramonita LabGouru, Aruna, MD  FLUoxetine (PROZAC) 20 MG/5ML solution Place 20 mg  into feeding tube daily.     [provider]  furosemide (LASIX) 20 MG tablet Place 20 mg into feeding tube daily.     [provider]  HYDROcodone-acetaminophen (NORCO) 10-325 MG tablet Place 1 tablet into feeding tube every 6 (six) hours as needed for moderate pain. 05/19/18   Gouru, Deanna Artis, MD  ipratropium-albuterol (DUONEB) 0.5-2.5 (3) MG/3ML SOLN Take 3 mLs by nebulization every 4 (four) hours as needed (shortness of breath).     [provider]  loratadine  (CLARITIN) 10 MG tablet Place 10 mg into feeding tube daily.    [provider]  magnesium hydroxide (MILK OF MAGNESIA) 400 MG/5ML suspension Place 30 mLs into feeding tube 2 (two) times daily as needed for mild constipation.     [provider]  Melatonin 3 MG TABS Place 1 tablet into feeding tube at bedtime.     [provider]  Misc Natural Products (OSTEO BI-FLEX ADV JOINT SHIELD PO) 1 tablet by PEG Tube route daily.     [provider]  Multiple Vitamin (MULTIVITAMIN) LIQD Place 15 mLs into feeding tube daily.     [provider]  Nutritional Supplements (FEEDING SUPPLEMENT, JEVITY 1.2 CAL,) LIQD Place 1,000 mLs into feeding tube continuous. 05/19/18   Ramonita Lab, MD  pantoprazole sodium (PROTONIX) 40 mg/20 mL PACK Place 20 mLs (40 mg total) into feeding tube 2 (two) times daily. 05/19/18   Gouru, Deanna Artis, MD  polyethylene glycol (MIRALAX / GLYCOLAX) packet Place 17 g into feeding tube daily.     [provider]  sennosides (SENOKOT) 8.8 MG/5ML syrup Place 10 mLs into feeding tube at bedtime.    [provider]  shark liver oil-cocoa butter (PREPARATION H) 0.25-3-85.5 % suppository Place 1 suppository rectally every 12 (twelve) hours as needed (rectal bleeding).    [provider]    Allergies Ciprofloxacin and Penicillins  Family History  Problem Relation Age of Onset  . CAD Mother   . CAD Father     Social History Social History   Tobacco Use  . Smoking status: Former Games developer  . Smokeless tobacco: Never Used  Substance Use Topics  . Alcohol use: No  . Drug use: No    Review of Systems  Constitutional: No fever/chills Eyes: No visual changes. ENT: No sore throat. Cardiovascular: Denies chest pain. Respiratory: Denies shortness of breath. Gastrointestinal: Positive for G-tube problem.  No abdominal pain.  No nausea, no vomiting.  No diarrhea.  No constipation. Genitourinary: Negative for  dysuria. Musculoskeletal: Negative for back pain. Skin: Negative for rash. Neurological: Negative for headaches, focal weakness or numbness.   ____________________________________________   PHYSICAL EXAM:  VITAL SIGNS: ED Triage Vitals  Enc Vitals Group     BP 07/21/18 0145 (!) 97/59     Pulse Rate 07/21/18 0145 93     Resp 07/21/18 0145 20     Temp 07/21/18 0145 99.3 F (37.4 C)     Temp Source 07/21/18 0145 Oral     SpO2 07/21/18 0145 100 %     Weight 07/21/18 0147 151 lb (68.5 kg)     Height 07/21/18 0147 5\' 6"  (1.676 m)     Head Circumference --      Peak Flow --      Pain Score 07/21/18 0146 0     Pain Loc --      Pain Edu? --      Excl. in GC? --     Constitutional: Alert and  oriented.  Chronically ill appearing and in no acute distress. Eyes: Conjunctivae are normal. PERRL. EOMI. Head: Atraumatic. Nose: No congestion/rhinnorhea. Mouth/Throat: Mucous membranes are moist.  Oropharynx non-erythematous. Neck: No stridor.   Cardiovascular: Normal rate, regular rhythm. Grossly normal heart sounds.  Good peripheral circulation. Respiratory: Normal respiratory effort.  No retractions. Lungs CTAB. Gastrointestinal: G-tube in place.  Soft and nontender. No distention. No abdominal bruits. No CVA tenderness. Musculoskeletal: No lower extremity tenderness nor edema.  No joint effusions. Neurologic: All extremity contractures.  Normal speech and language.  Skin:  Skin is warm, dry and intact. No rash noted. Psychiatric: Mood and affect are normal. Speech and behavior are normal.  ____________________________________________   LABS (all labs ordered are listed, but only abnormal results are displayed)  Labs Reviewed - No data to display ____________________________________________  EKG  None ____________________________________________  RADIOLOGY  ED MD interpretation: PEG tube in place  Official radiology report(s): Dg Abdomen 1 View  Result Date:  07/21/2018 CLINICAL DATA:  Evaluate PEG placement. EXAM: ABDOMEN - 1 VIEW COMPARISON:  None. FINDINGS: AP view of the abdomen obtained after installation of enteric contrast through indwelling gastrostomy tube (amount and type of contrast not specified). Contrast opacifies gastrostomy tubing in the stomach. No evidence of extravasation or leak. Large colonic stool burden without bowel obstruction. IVC filter in place. IMPRESSION: Enteric tube opacifying the stomach consistent with intraluminal PEG placement, no extravasation or leak. Electronically Signed   By: Rubye Oaks M.D.   On: 07/21/2018 03:19    ____________________________________________   PROCEDURES  Procedure(s) performed: None  Procedures  Critical Care performed: No  ____________________________________________   INITIAL IMPRESSION / ASSESSMENT AND PLAN / ED COURSE  As part of my medical decision making, I reviewed the following data within the electronic MEDICAL RECORD NUMBER History obtained from family, Nursing notes reviewed and incorporated, Old chart reviewed, Radiograph reviewed and Notes from prior ED visits   57 year old male sent for G-tube evaluation.  Reportedly patient had an x-ray without Gastrografin at the facility the radiology could not appropriately interpreted.  Will obtain KUB with Gastrografin and reassess.  Clinical Course as of Jul 21 508  Wynelle Link Jul 21, 2018  0213 Spoke with nurse from facility who states they were unable to get a fax report of the x-ray.  She read to me the x-ray report which sounds like the radiologist could not see the tip of the new PEG tube secondary to lack of Gastrografin.   [JS]  0507 Nursing could not aspirate any fluid from the bulb of PEG tube.  She reinflated the tube with 20 cc sterile fluid with good effect.  Strict return precautions given.  Patient and family members verbalize understanding and agree with plan of care.   [JS]    Clinical Course User Index [JS] Irean Hong, MD     ____________________________________________   FINAL CLINICAL IMPRESSION(S) / ED DIAGNOSES  Final diagnoses:  Gastrostomy tube in place [facility])     ED Discharge Orders    None       Note:  This document was prepared using Dragon voice recognition software and may include unintentional dictation errors.    Irean Hong, MD 07/21/18 [phone]

## 2018-07-21 NOTE — ED Notes (Signed)
This nurse unable to get through to [facility]. On two occasions the phone was hung up while attempting to speak to someone. EMS aware to have their staff call back with any questions.

## 2018-07-21 NOTE — ED Triage Notes (Signed)
Pt reports getting his peg tube replaced yesterday. Pt states it will not stay in. Pt reports leaking around the tube.

## 2018-07-21 NOTE — ED Notes (Signed)
Unable to get [facility] to answer the phone for report. Will try again.

## 2018-07-21 NOTE — ED Triage Notes (Addendum)
Pt arrived via EMS from [facility] with PEG tube issues; tube was replaced today at the facility as it was clogged after pt received some medications; pt says tube was removed and replaced by staff at the facility and an xray was ordered; about 3 hours passed between the time the order was placed and results were received; radiology reported to NH that the tip of the tube could not be seen in the abdomen in the film so pt was sent to the ED; pt is quadriplegic and denies pain; pt says there has been some leaking around the entry area of tube but pt says this is normal; pt awake and alert; talking in complete and coherent sentences

## 2018-07-21 NOTE — ED Notes (Signed)
Called out front for pt's visitors and they have arrived to the room; cousin and his wife, who is POA, are at bedside; pt reports he's been taking antibiotics for UTI and pneumonia; had a fever earlier in the evening but is currently afebrile; pt adds he has been having left shoulder pain, tender/painful to move, but this is not new;

## 2018-08-01 ENCOUNTER — Other Ambulatory Visit: Payer: Self-pay

## 2018-08-01 ENCOUNTER — Emergency Department: Payer: Medicaid Other

## 2018-08-01 ENCOUNTER — Encounter: Payer: Self-pay | Admitting: Emergency Medicine

## 2018-08-01 ENCOUNTER — Inpatient Hospital Stay
Admission: EM | Admit: 2018-08-01 | Discharge: 2018-08-07 | DRG: 871 | Disposition: A | Discharge status: Skilled Nursing Facility | Payer: Medicaid Other | Source: Skilled Nursing Facility | Attending: Internal Medicine | Admitting: Internal Medicine

## 2018-08-01 DIAGNOSIS — D509 Iron deficiency anemia, unspecified: Secondary | ICD-10-CM | POA: Diagnosis present

## 2018-08-01 DIAGNOSIS — Z7401 Bed confinement status: Secondary | ICD-10-CM

## 2018-08-01 DIAGNOSIS — Z881 Allergy status to other antibiotic agents status: Secondary | ICD-10-CM

## 2018-08-01 DIAGNOSIS — J9601 Acute respiratory failure with hypoxia: Secondary | ICD-10-CM | POA: Diagnosis present

## 2018-08-01 DIAGNOSIS — F419 Anxiety disorder, unspecified: Secondary | ICD-10-CM | POA: Diagnosis present

## 2018-08-01 DIAGNOSIS — Z87891 Personal history of nicotine dependence: Secondary | ICD-10-CM

## 2018-08-01 DIAGNOSIS — G8250 Quadriplegia, unspecified: Secondary | ICD-10-CM | POA: Diagnosis present

## 2018-08-01 DIAGNOSIS — A419 Sepsis, unspecified organism: Principal | ICD-10-CM | POA: Diagnosis present

## 2018-08-01 DIAGNOSIS — D649 Anemia, unspecified: Secondary | ICD-10-CM | POA: Diagnosis not present

## 2018-08-01 DIAGNOSIS — J918 Pleural effusion in other conditions classified elsewhere: Secondary | ICD-10-CM | POA: Diagnosis present

## 2018-08-01 DIAGNOSIS — E43 Unspecified severe protein-calorie malnutrition: Secondary | ICD-10-CM | POA: Diagnosis present

## 2018-08-01 DIAGNOSIS — F339 Major depressive disorder, recurrent, unspecified: Secondary | ICD-10-CM | POA: Diagnosis present

## 2018-08-01 DIAGNOSIS — J869 Pyothorax without fistula: Secondary | ICD-10-CM | POA: Diagnosis present

## 2018-08-01 DIAGNOSIS — L89159 Pressure ulcer of sacral region, unspecified stage: Secondary | ICD-10-CM | POA: Diagnosis present

## 2018-08-01 DIAGNOSIS — R079 Chest pain, unspecified: Secondary | ICD-10-CM

## 2018-08-01 DIAGNOSIS — Z950 Presence of cardiac pacemaker: Secondary | ICD-10-CM

## 2018-08-01 DIAGNOSIS — K921 Melena: Secondary | ICD-10-CM | POA: Diagnosis present

## 2018-08-01 DIAGNOSIS — Z22322 Carrier or suspected carrier of Methicillin resistant Staphylococcus aureus: Secondary | ICD-10-CM | POA: Diagnosis not present

## 2018-08-01 DIAGNOSIS — J90 Pleural effusion, not elsewhere classified: Secondary | ICD-10-CM

## 2018-08-01 DIAGNOSIS — J15212 Pneumonia due to Methicillin resistant Staphylococcus aureus: Secondary | ICD-10-CM | POA: Diagnosis present

## 2018-08-01 DIAGNOSIS — Z981 Arthrodesis status: Secondary | ICD-10-CM | POA: Diagnosis not present

## 2018-08-01 DIAGNOSIS — R1310 Dysphagia, unspecified: Secondary | ICD-10-CM | POA: Diagnosis present

## 2018-08-01 DIAGNOSIS — Q161 Congenital absence, atresia and stricture of auditory canal (external): Secondary | ICD-10-CM

## 2018-08-01 DIAGNOSIS — K219 Gastro-esophageal reflux disease without esophagitis: Secondary | ICD-10-CM | POA: Diagnosis present

## 2018-08-01 DIAGNOSIS — Z8701 Personal history of pneumonia (recurrent): Secondary | ICD-10-CM

## 2018-08-01 DIAGNOSIS — J189 Pneumonia, unspecified organism: Secondary | ICD-10-CM

## 2018-08-01 DIAGNOSIS — D638 Anemia in other chronic diseases classified elsewhere: Secondary | ICD-10-CM | POA: Diagnosis present

## 2018-08-01 DIAGNOSIS — Z79890 Hormone replacement therapy (postmenopausal): Secondary | ICD-10-CM

## 2018-08-01 DIAGNOSIS — Z6826 Body mass index (BMI) 26.0-26.9, adult: Secondary | ICD-10-CM

## 2018-08-01 DIAGNOSIS — Z880 Allergy status to penicillin: Secondary | ICD-10-CM

## 2018-08-01 DIAGNOSIS — Z66 Do not resuscitate: Secondary | ICD-10-CM | POA: Diagnosis present

## 2018-08-01 DIAGNOSIS — Z79899 Other long term (current) drug therapy: Secondary | ICD-10-CM | POA: Diagnosis not present

## 2018-08-01 DIAGNOSIS — Z931 Gastrostomy status: Secondary | ICD-10-CM

## 2018-08-01 DIAGNOSIS — Y95 Nosocomial condition: Secondary | ICD-10-CM | POA: Diagnosis present

## 2018-08-01 DIAGNOSIS — Z8249 Family history of ischemic heart disease and other diseases of the circulatory system: Secondary | ICD-10-CM

## 2018-08-01 DIAGNOSIS — T85598A Other mechanical complication of other gastrointestinal prosthetic devices, implants and grafts, initial encounter: Secondary | ICD-10-CM

## 2018-08-01 DIAGNOSIS — Z87820 Personal history of traumatic brain injury: Secondary | ICD-10-CM

## 2018-08-01 LAB — COMPREHENSIVE METABOLIC PANEL
ALBUMIN: 1.9 g/dL — AB (ref 3.5–5.0)
ALK PHOS: 91 U/L (ref 38–126)
ALT: 24 U/L (ref 0–44)
ANION GAP: 8 (ref 5–15)
AST: 30 U/L (ref 15–41)
BUN: 34 mg/dL — ABNORMAL HIGH (ref 6–20)
CALCIUM: 8 mg/dL — AB (ref 8.9–10.3)
CO2: 27 mmol/L (ref 22–32)
Chloride: 102 mmol/L (ref 98–111)
Creatinine, Ser: 1.14 mg/dL (ref 0.61–1.24)
GFR calc Af Amer: >60 mL/min (ref >60)
GFR calc non Af Amer: >60 mL/min (ref >60)
GLUCOSE: 119 mg/dL — AB (ref 70–99)
Potassium: 4 mmol/L (ref 3.5–5.1)
SODIUM: 137 mmol/L (ref 135–145)
Total Bilirubin: 0.4 mg/dL (ref 0.3–1.2)
Total Protein: 6.9 g/dL (ref 6.5–8.1)

## 2018-08-01 LAB — URINALYSIS, COMPLETE (UACMP) WITH MICROSCOPIC
Bilirubin Urine: NEGATIVE
Glucose, UA: NEGATIVE mg/dL
HGB URINE DIPSTICK: NEGATIVE
KETONES UR: NEGATIVE mg/dL
Nitrite: NEGATIVE
Protein, ur: NEGATIVE mg/dL
SQUAMOUS EPITHELIAL / LPF: NONE SEEN (ref 0–5)
Specific Gravity, Urine: 1.016 (ref 1.005–1.030)
pH: 6 (ref 5.0–8.0)

## 2018-08-01 LAB — CBC WITH DIFFERENTIAL/PLATELET
Basophils Absolute: 0 10*3/uL (ref 0–0.1)
Basophils Relative: 0 %
EOS ABS: 0.1 10*3/uL (ref 0–0.7)
Eosinophils Relative: 1 %
HCT: 21.2 % — ABNORMAL LOW (ref 40.0–52.0)
HEMOGLOBIN: 6.8 g/dL — AB (ref 13.0–18.0)
Lymphocytes Relative: 2 %
Lymphs Abs: 0.3 10*3/uL — ABNORMAL LOW (ref 1.0–3.6)
MCH: 26.8 pg (ref 26.0–34.0)
MCHC: 32.3 g/dL (ref 32.0–36.0)
MCV: 82.8 fL (ref 80.0–100.0)
Monocytes Absolute: 1.4 10*3/uL — ABNORMAL HIGH (ref 0.2–1.0)
Monocytes Relative: 7 %
NEUTROS PCT: 90 %
Neutro Abs: 18.1 10*3/uL — ABNORMAL HIGH (ref 1.4–6.5)
Platelets: 846 10*3/uL — ABNORMAL HIGH (ref 150–440)
RBC: 2.55 MIL/uL — ABNORMAL LOW (ref 4.40–5.90)
RDW: 19 % — ABNORMAL HIGH (ref 11.5–14.5)
WBC: 20 10*3/uL — ABNORMAL HIGH (ref 3.8–10.6)

## 2018-08-01 LAB — EXPECTORATED SPUTUM ASSESSMENT W GRAM STAIN, RFLX TO RESP C

## 2018-08-01 LAB — STREP PNEUMONIAE URINARY ANTIGEN: STREP PNEUMO URINARY ANTIGEN: NEGATIVE

## 2018-08-01 LAB — PREPARE RBC (CROSSMATCH)

## 2018-08-01 LAB — BRAIN NATRIURETIC PEPTIDE: B NATRIURETIC PEPTIDE 5: 98 pg/mL (ref 0.0–100.0)

## 2018-08-01 LAB — PROTIME-INR
INR: 1.24
PROTHROMBIN TIME: 15.5 s — AB (ref 11.4–15.2)

## 2018-08-01 LAB — EXPECTORATED SPUTUM ASSESSMENT W REFEX TO RESP CULTURE

## 2018-08-01 LAB — MRSA PCR SCREENING: MRSA by PCR: POSITIVE — AB

## 2018-08-01 LAB — LACTIC ACID, PLASMA: Lactic Acid, Venous: 1.8 mmol/L (ref 0.5–1.9)

## 2018-08-01 LAB — FERRITIN: FERRITIN: 60 ng/mL (ref 24–336)

## 2018-08-01 LAB — TROPONIN I

## 2018-08-01 MED ORDER — BACLOFEN 10 MG PO TABS: 10.0000 mg | ORAL_TABLET | Freq: Two times a day (BID) | ORAL | Status: DC

## 2018-08-01 MED ORDER — ONDANSETRON HCL 4 MG PO TABS: 4.0000 mg | ORAL_TABLET | Freq: Four times a day (QID) | ORAL | Status: DC | PRN

## 2018-08-01 MED ORDER — SODIUM CHLORIDE 0.9 % IV BOLUS
1000.0000 mL | Freq: Once | INTRAVENOUS | Status: AC
  Administered 2018-08-01: 1000 mL via INTRAVENOUS

## 2018-08-01 MED ORDER — HYDROCODONE-ACETAMINOPHEN 10-325 MG PO TABS
1.0000 | ORAL_TABLET | Freq: Four times a day (QID) | ORAL | Status: DC | PRN
  Administered 2018-08-01 – 2018-08-07 (×17): 1
  Filled 2018-08-01 (×20): qty 1

## 2018-08-01 MED ORDER — PANTOPRAZOLE SODIUM 40 MG PO PACK
40.0000 mg | PACK | Freq: Two times a day (BID) | ORAL | Status: DC
  Administered 2018-08-01 – 2018-08-03 (×6): 40 mg
  Filled 2018-08-01 (×8): qty 20

## 2018-08-01 MED ORDER — MELATONIN 5 MG PO TABS
2.5000 mg | ORAL_TABLET | Freq: Every day | ORAL | Status: DC
  Administered 2018-08-02 – 2018-08-05 (×4): 2.5 mg
  Filled 2018-08-01 (×7): qty 0.5

## 2018-08-01 MED ORDER — SODIUM CHLORIDE 0.9% FLUSH
3.0000 mL | Freq: Two times a day (BID) | INTRAVENOUS | Status: DC
  Administered 2018-08-01 – 2018-08-07 (×13): 3 mL via INTRAVENOUS

## 2018-08-01 MED ORDER — POLYETHYLENE GLYCOL 3350 17 G PO PACK
17.0000 g | PACK | Freq: Every day | ORAL | Status: DC
  Administered 2018-08-01 – 2018-08-07 (×5): 17 g
  Filled 2018-08-01 (×5): qty 1

## 2018-08-01 MED ORDER — ALPRAZOLAM 0.5 MG PO TABS: 0.5000 mg | ORAL_TABLET | Freq: Every day | ORAL | Status: DC

## 2018-08-01 MED ORDER — ONDANSETRON HCL 4 MG/2ML IJ SOLN
4.0000 mg | Freq: Four times a day (QID) | INTRAMUSCULAR | Status: DC | PRN
  Administered 2018-08-02: 06:00:00 4 mg via INTRAVENOUS
  Filled 2018-08-01: qty 2

## 2018-08-01 MED ORDER — CEFEPIME HCL 2 G IJ SOLR
2.0000 g | Freq: Once | INTRAMUSCULAR | Status: AC
  Administered 2018-08-01: 2 g via INTRAVENOUS
  Filled 2018-08-01: qty 2

## 2018-08-01 MED ORDER — SODIUM CHLORIDE 0.9 % IV SOLN
10.0000 mL/h | Freq: Once | INTRAVENOUS | Status: AC
  Administered 2018-08-01: 10 mL/h via INTRAVENOUS

## 2018-08-01 MED ORDER — DIPHENHYDRAMINE HCL 12.5 MG/5ML PO ELIX
25.0000 mg | ORAL_SOLUTION | Freq: Three times a day (TID) | ORAL | Status: DC | PRN
  Administered 2018-08-01 – 2018-08-07 (×11): 25 mg
  Filled 2018-08-01 (×14): qty 10

## 2018-08-01 MED ORDER — JEVITY 1.5 CAL/FIBER PO LIQD
1000.0000 mL | Freq: Three times a day (TID) | ORAL | Status: DC
  Administered 2018-08-01 – 2018-08-02 (×2): 1000 mL

## 2018-08-01 MED ORDER — CHLORHEXIDINE GLUCONATE CLOTH 2 % EX PADS
6.0000 | MEDICATED_PAD | Freq: Every day | CUTANEOUS | Status: AC
  Administered 2018-08-04 – 2018-08-06 (×3): 6 via TOPICAL

## 2018-08-01 MED ORDER — BACLOFEN 10 MG PO TABS
20.0000 mg | ORAL_TABLET | Freq: Every day | ORAL | Status: DC
  Administered 2018-08-02 – 2018-08-06 (×5): 20 mg
  Filled 2018-08-01 (×7): qty 2

## 2018-08-01 MED ORDER — DOXEPIN HCL 10 MG PO CAPS
10.0000 mg | ORAL_CAPSULE | Freq: Every day | ORAL | Status: DC
  Administered 2018-08-01 – 2018-08-06 (×6): 10 mg
  Filled 2018-08-01 (×7): qty 1

## 2018-08-01 MED ORDER — FLUOXETINE HCL 20 MG/5ML PO SOLN
20.0000 mg | Freq: Every day | ORAL | Status: DC
  Administered 2018-08-01 – 2018-08-07 (×6): 20 mg
  Filled 2018-08-01 (×7): qty 5

## 2018-08-01 MED ORDER — FUROSEMIDE 20 MG PO TABS
20.0000 mg | ORAL_TABLET | Freq: Every day | ORAL | Status: DC
  Administered 2018-08-01 – 2018-08-03 (×3): 20 mg
  Filled 2018-08-01 (×3): qty 1

## 2018-08-01 MED ORDER — FERROUS SULFATE 220 (44 FE) MG/5ML PO ELIX
325.0000 mg | ORAL_SOLUTION | Freq: Three times a day (TID) | ORAL | Status: DC
  Administered 2018-08-01 – 2018-08-07 (×13): 325 mg
  Filled 2018-08-01 (×20): qty 7.4

## 2018-08-01 MED ORDER — ALBUTEROL SULFATE (2.5 MG/3ML) 0.083% IN NEBU
2.5000 mg | INHALATION_SOLUTION | RESPIRATORY_TRACT | Status: DC | PRN
  Administered 2018-08-01 – 2018-08-06 (×2): 2.5 mg via RESPIRATORY_TRACT
  Filled 2018-08-01 (×2): qty 3

## 2018-08-01 MED ORDER — IPRATROPIUM-ALBUTEROL 0.5-2.5 (3) MG/3ML IN SOLN
3.0000 mL | Freq: Four times a day (QID) | RESPIRATORY_TRACT | Status: DC
  Administered 2018-08-01 – 2018-08-05 (×17): 3 mL via RESPIRATORY_TRACT
  Filled 2018-08-01 (×17): qty 3

## 2018-08-01 MED ORDER — METRONIDAZOLE IN NACL 5-0.79 MG/ML-% IV SOLN
500.0000 mg | Freq: Three times a day (TID) | INTRAVENOUS | Status: DC
  Administered 2018-08-01 – 2018-08-05 (×13): 500 mg via INTRAVENOUS
  Filled 2018-08-01 (×15): qty 100

## 2018-08-01 MED ORDER — ALPRAZOLAM 0.5 MG PO TABS
0.5000 mg | ORAL_TABLET | Freq: Two times a day (BID) | ORAL | Status: DC | PRN
  Administered 2018-08-02: 17:00:00 0.5 mg
  Filled 2018-08-01: qty 1

## 2018-08-01 MED ORDER — SODIUM CHLORIDE 0.9 % IV SOLN
1.0000 g | Freq: Three times a day (TID) | INTRAVENOUS | Status: DC
  Administered 2018-08-01 – 2018-08-05 (×11): 1 g via INTRAVENOUS
  Filled 2018-08-01 (×14): qty 1

## 2018-08-01 MED ORDER — LORATADINE 10 MG PO TABS
10.0000 mg | ORAL_TABLET | Freq: Every day | ORAL | Status: DC
  Administered 2018-08-01 – 2018-08-07 (×7): 10 mg
  Filled 2018-08-01 (×7): qty 1

## 2018-08-01 MED ORDER — BACLOFEN 10 MG PO TABS
10.0000 mg | ORAL_TABLET | Freq: Every day | ORAL | Status: DC
  Administered 2018-08-01 – 2018-08-07 (×6): 10 mg
  Filled 2018-08-01 (×7): qty 1

## 2018-08-01 MED ORDER — FREE WATER
250.0000 mL | Freq: Three times a day (TID) | Status: DC
  Administered 2018-08-01 – 2018-08-03 (×5): 250 mL

## 2018-08-01 MED ORDER — VANCOMYCIN HCL IN DEXTROSE 1-5 GM/200ML-% IV SOLN
1000.0000 mg | Freq: Once | INTRAVENOUS | Status: AC
  Administered 2018-08-01: 1000 mg via INTRAVENOUS
  Filled 2018-08-01: qty 200

## 2018-08-01 MED ORDER — DOCUSATE SODIUM 50 MG/5ML PO LIQD
200.0000 mg | Freq: Every day | ORAL | Status: DC
  Administered 2018-08-01 – 2018-08-06 (×5): 200 mg
  Filled 2018-08-01 (×7): qty 20

## 2018-08-01 MED ORDER — MUPIROCIN 2 % EX OINT
1.0000 "application " | TOPICAL_OINTMENT | Freq: Two times a day (BID) | CUTANEOUS | Status: AC
  Administered 2018-08-02 – 2018-08-06 (×10): 1 via NASAL
  Filled 2018-08-01 (×2): qty 22

## 2018-08-01 MED ORDER — ACETAMINOPHEN 325 MG PO TABS: 650.0000 mg | ORAL_TABLET | Freq: Four times a day (QID) | ORAL | Status: DC | PRN

## 2018-08-01 MED ORDER — SENNOSIDES 8.8 MG/5ML PO SYRP
10.0000 mL | ORAL_SOLUTION | Freq: Every day | ORAL | Status: DC
  Administered 2018-08-01 – 2018-08-05 (×2): 10 mL
  Filled 2018-08-01 (×7): qty 10

## 2018-08-01 NOTE — ED Notes (Addendum)
Admitting MD to bedside to assess patient. Pt taken off NRB, placed on 2L via [place]. Pt maintaining O2 saturations 95-97% on 2L via [place].

## 2018-08-01 NOTE — ED Notes (Signed)
Recollect type and screen sent to lab. Informed consent for blood transfusion obtained from patient's cousin. Pt unable to sign due to lack of use of hands/contractures of upper extremities.

## 2018-08-01 NOTE — ED Triage Notes (Signed)
Pt arrived via EMS from [facility] with reports by staff that pt was found in respiratory distress this AM with periods of apnea witnessed by EMS on arrival. Pt arrived to ED with non-re breather, alert and verbal. Pt able to answer questions. MD at bedside.

## 2018-08-01 NOTE — Progress Notes (Signed)
CODE SEPSIS - PHARMACY COMMUNICATION  **Broad Spectrum Antibiotics should be administered within 1 hour of Sepsis diagnosis**  Time Code Sepsis Called/Page Received: [id number]  Antibiotics Ordered: cefepime, vancomycin  Time of 1st antibiotic administration: 0709  Additional action taken by pharmacy:   If necessary, Name of Provider/Nurse Contacted:     Valentina Guhristy, Fatiha Guzy D ,PharmD Clinical Pharmacist  08/01/2018  8:58 AM

## 2018-08-01 NOTE — ED Notes (Signed)
Pt with green and dark brown liquid stool from lower back to bilateral knees with brief in place. Pt cleaned and new bandages applied around suprapubic catheter and pressure ulcer to coccyx.
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
## 2018-08-01 NOTE — Progress Notes (Signed)
Admit for aspiration PNA. Anemia with chronic GI losses. NPO except meds. GI consult. Transfuse 1 unit PRBC.

## 2018-08-01 NOTE — ED Provider Notes (Signed)
[facility] Emergency Department Provider Note  ____________________________________________   I have reviewed the triage vital signs and the nursing notes.   HISTORY  Chief Complaint Respiratory Distress   History limited by: Not Limited   HPI Alexander CofferJames Escobar is a 57 y.o. male who presents to the emergency department today who presents to the emergency department today via EMS because of concerns for respiratory distress.  Patient is coming from [facility].  Patient has history of traumatic brain injury.  This morning nursing staff noticed patient was having difficulty with breathing.  He states that all started today and that his breathing was within normal limits yesterday.  He has had a cough.  He states he has had pneumonias in the past and this reminds him of his previous pneumonias.  Per EMS patient was hypoxic with the best O2 sat that they were able to obtain at 70.  They did state that he clinically appeared to improve after being put on nonrebreather.    Per medical record review patient has a history of quadriplegia, sepsis, pneumonia.   Past Medical History:  Diagnosis Date  . Congenital absence of external auditory canal   . Gastroesophageal reflux disease   . Presence of permanent cardiac pacemaker   . Quadriplegia (HCC)   . Recurrent major depression (HCC)   . Severe protein-calorie malnutrition [facility](HCC)     Patient Active Problem List   Diagnosis Date Noted  . Iron deficiency anemia due to chronic blood loss   . Dark stools   . GI bleed 05/18/2018  . Pressure injury of skin 07/04/2017  . Sepsis (HCC) 07/03/2017  . UTI (urinary tract infection) due to urinary indwelling Foley catheter (HCC) 07/03/2017  . Pneumonia 07/03/2017    Past Surgical History:  Procedure Laterality Date  . ESOPHAGOGASTRODUODENOSCOPY N/A 05/19/2018   Procedure: ESOPHAGOGASTRODUODENOSCOPY (EGD);  Surgeon: Toney ReilVanga, Rohini Reddy, MD;  Location[facility] ENDOSCOPY;   Service: Gastroenterology;  Laterality: N/A;  . PEG TUBE PLACEMENT    . SUPRAPUBIC CATHETER INSERTION      Prior to Admission medications   Medication Sig Start Date End Date Taking? Authorizing Provider  ALPRAZolam Prudy Feeler(XANAX) 0.5 MG tablet Place 1 tablet (0.5 mg total) into feeding tube at bedtime. 05/19/18   Gouru, Deanna ArtisAruna, MD  Amino Acids-Protein Hydrolys (FEEDING SUPPLEMENT, PRO-STAT SUGAR FREE 64,) LIQD Place 30 mLs into feeding tube 3 (three) times daily. Patient taking differently: Place 30 mLs into feeding tube daily.  07/08/17   Enedina FinnerPatel, Sona, MD  ascorbic acid (VITAMIN C) 500 MG tablet Place 500 mg into feeding tube daily.    [provider]  baclofen (LIORESAL) 10 MG tablet Place 1-2 tablets (10-20 mg total) into feeding tube 2 (two) times daily. Take 10 mg by mouth in the morning and 20 mg by mouth at bedtime. 05/19/18   Gouru, Deanna ArtisAruna, MD  bisacodyl (DULCOLAX) 10 MG suppository Place 10 mg rectally daily.    [provider]  diphenhydrAMINE (BENADRYL) 12.5 MG/5ML elixir Place 10 mLs (25 mg total) into feeding tube every 8 (eight) hours as needed for allergies. 05/19/18   Gouru, Deanna ArtisAruna, MD  docusate (COLACE) 50 MG/5ML liquid Place 200 mg into feeding tube at bedtime.    [provider]  doxepin (SINEQUAN) 10 MG capsule Place 10 mg into feeding tube at bedtime.     [provider]  Ferrous Sulfate 5 MG/20ML LIQD 325 mg by PEG Tube route 3 (three) times daily with meals. 05/19/18 08/20/18  Gouru, Deanna ArtisAruna,  MD  FLUoxetine (PROZAC) 20 MG/5ML solution Place 20 mg into feeding tube daily.     [provider]  furosemide (LASIX) 20 MG tablet Place 20 mg into feeding tube daily.     [provider]  HYDROcodone-acetaminophen (NORCO) 10-325 MG tablet Place 1 tablet into feeding tube every 6 (six) hours as needed for moderate pain. 05/19/18   Gouru, Deanna Artis, MD  ipratropium-albuterol (DUONEB) 0.5-2.5 (3) MG/3ML SOLN Take 3 mLs by nebulization every 4 (four) hours as  needed (shortness of breath).     [provider]  loratadine (CLARITIN) 10 MG tablet Place 10 mg into feeding tube daily.    [provider]  magnesium hydroxide (MILK OF MAGNESIA) 400 MG/5ML suspension Place 30 mLs into feeding tube 2 (two) times daily as needed for mild constipation.     [provider]  Melatonin 3 MG TABS Place 1 tablet into feeding tube at bedtime.     [provider]  Misc Natural Products (OSTEO BI-FLEX ADV JOINT SHIELD PO) 1 tablet by PEG Tube route daily.     [provider]  Multiple Vitamin (MULTIVITAMIN) LIQD Place 15 mLs into feeding tube daily.     [provider]  Nutritional Supplements (FEEDING SUPPLEMENT, JEVITY 1.2 CAL,) LIQD Place 1,000 mLs into feeding tube continuous. 05/19/18   Ramonita Lab, MD  pantoprazole sodium (PROTONIX) 40 mg/20 mL PACK Place 20 mLs (40 mg total) into feeding tube 2 (two) times daily. 05/19/18   Gouru, Deanna Artis, MD  polyethylene glycol (MIRALAX / GLYCOLAX) packet Place 17 g into feeding tube daily.     [provider]  sennosides (SENOKOT) 8.8 MG/5ML syrup Place 10 mLs into feeding tube at bedtime.    [provider]  shark liver oil-cocoa butter (PREPARATION H) 0.25-3-85.5 % suppository Place 1 suppository rectally every 12 (twelve) hours as needed (rectal bleeding).    [provider]    Allergies Ciprofloxacin and Penicillins  Family History  Problem Relation Age of Onset  . CAD Mother   . CAD Father     Social History Social History   Tobacco Use  . Smoking status: Former Games developer  . Smokeless tobacco: Never Used  Substance Use Topics  . Alcohol use: No  . Drug use: No    Review of Systems Constitutional: No fever/chills Eyes: No visual changes. ENT: No sore throat. Cardiovascular: Denies chest pain. Respiratory: Positive for shortness of breath. Gastrointestinal: No abdominal pain.  No nausea, no vomiting.  No diarrhea.   Genitourinary:  Negative for dysuria. Musculoskeletal: Negative for back pain. Skin: Negative for rash. Neurological: Negative for headaches, focal weakness or numbness.  ____________________________________________   PHYSICAL EXAM:  VITAL SIGNS: ED Triage Vitals  Enc Vitals Group     BP 08/01/18 0608 (!) 138/123     Pulse Rate 08/01/18 0608 (!) 111     Resp 08/01/18 0608 (!) 22     Temp 08/01/18 0608 97.9 F (36.6 C)     Temp Source 08/01/18 0608 Oral     SpO2 08/01/18 0608 100 %     Weight 08/01/18 0607 151 lb 0.2 oz (68.5 kg)     Height --      Head Circumference --      Peak Flow --      Pain Score 08/01/18 0607 0   Constitutional: Awake and alert Eyes: Conjunctivae are normal.  ENT      Head: Normocephalic and atraumatic.      Nose: No  congestion/rhinnorhea.      Mouth/Throat: Mucous membranes are moist.      Neck: No stridor. Hematological/Lymphatic/Immunilogical: No cervical lymphadenopathy. Cardiovascular: Normal rate, regular rhythm.  No murmurs, rubs, or gallops.  Respiratory: Mild respiratory distress and increased effort.  Gastrointestinal: Soft and non tender. Feeding tube and suprapubic catheter in place.  Genitourinary: Deferred Musculoskeletal: Normal range of motion in all extremities. No lower extremity edema. Neurologic:  Normal speech and language. Sequela of TBI Skin:  Skin is warm, dry and intact. No rash noted. Psychiatric: Mood and affect are normal. Speech and behavior are normal. Patient exhibits appropriate insight and judgment.  ____________________________________________    LABS (pertinent positives/negatives)  CBC wbc 20.0, hgb 6.8, plt 846 CMP na 137, k 4.0, glu 119, cr 1.14 INR 1.24  ____________________________________________   EKG  I, Phineas SemenGraydon Shrinika Blatz, attending physician, personally viewed and interpreted this EKG  EKG Time: 0606 Rate: 108 Rhythm: sinus tachycardia Axis: normal Intervals: qtc 440 QRS: narrow ST changes: no st  elevation Impression: abnormal ekg  ____________________________________________    RADIOLOGY  CXR Bilateral pleural effusions. Concern for pneumonia.  ____________________________________________   PROCEDURES  Procedures  CRITICAL CARE Performed by: Phineas SemenGraydon Connar Keating   Total critical care time: 35 minutes  Critical care time was exclusive of separately billable procedures and treating other patients.  Critical care was necessary to treat or prevent imminent or life-threatening deterioration.  Critical care was time spent personally by me on the following activities: development of treatment plan with patient and/or surrogate as well as nursing, discussions with consultants, evaluation of patient's response to treatment, examination of patient, obtaining history from patient or surrogate, ordering and performing treatments and interventions, ordering and review of laboratory studies, ordering and review of radiographic studies, pulse oximetry and re-evaluation of patient's condition.  ____________________________________________   INITIAL IMPRESSION / ASSESSMENT AND PLAN / ED COURSE  Pertinent labs & imaging results that were available during my care of the patient were reviewed by me and considered in my medical decision making (see chart for details).   Patient presented because of concern for respiratory issues from living facility. On presentation the patient was doing well on non rebreather. Was noted to be tachycardic. X-ray consistent with pneumonia. Patient also found to be anemic. Discussed findings with patient. Will plan on antibiotics and blood transfusion. Will admit.  ____________________________________________   FINAL CLINICAL IMPRESSION(S) / ED DIAGNOSES  Final diagnoses:  Healthcare-associated pneumonia  Sepsis, due to unspecified organism (HCC)  Anemia, unspecified type     Note: This dictation was prepared with Dragon dictation. Any transcriptional  errors that result from this process are unintentional     Phineas SemenGoodman, Akasia Ahmad, MD 08/01/18 [phone]

## 2018-08-01 NOTE — [place] FL2 (Signed)
[place] MEDICAID FL2 LEVEL OF CARE SCREENING TOOL     IDENTIFICATION  Patient Name: Alexander Escobar Forero Birthdate: 11/07/1961 Sex: male Admission Date (Current Location): 08/01/2018  [facility] [place] [place]  [name] ([id number]) Facility and Address:  [facility], [address], [place], [place] [address]      Provider Number: [id number]  Attending Physician Name and Address:  Milagros LollSudini, Srikar, MD  Relative Name and Phone Number:       Current Level of Care: Hospital Recommended Level of Care: Skilled Nursing Facility Prior Approval Number:    Date Approved/Denied:   PASRR Number: [id number])  Discharge Plan: SNF    Current Diagnoses: Patient Active Problem List   Diagnosis Date Noted  . Iron deficiency anemia due to chronic blood loss   . Dark stools   . GI bleed 05/18/2018  . Pressure injury of skin 07/04/2017  . Sepsis (HCC) 07/03/2017  . UTI (urinary tract infection) due to urinary indwelling Foley catheter (HCC) 07/03/2017  . Pneumonia 07/03/2017    Orientation RESPIRATION BLADDER Height & Weight     Self, Time, Situation, Place  O2(2 Liters Oxygen. ) Continent Weight: 151 lb 0.2 oz (68.5 kg) Height:     BEHAVIORAL SYMPTOMS/MOOD NEUROLOGICAL BOWEL NUTRITION STATUS      Continent Feeding tube  AMBULATORY STATUS COMMUNICATION OF NEEDS Skin   Total Care Verbally Normal                       Personal Care Assistance Level of Assistance  Bathing, Feeding, Dressing Bathing Assistance: Maximum assistance Feeding assistance: Maximum assistance Dressing Assistance: Maximum assistance     Functional Limitations Info  Sight, Hearing, Speech Sight Info: Impaired Hearing Info: Impaired Speech Info: Impaired    SPECIAL CARE FACTORS FREQUENCY                       Contractures      Additional Factors Info  Code Status, Allergies, Isolation Precautions Code Status Info: (DNR ) Allergies Info: (Ciprofloxacin,  Contrast Media Iodinated Diagnostic Agents, Penicillins)     Isolation Precautions Info: (MRSA nasal swab. )     Current Medications (08/01/2018):  This is the current hospital active medication list Current Facility-Administered Medications  Medication Dose Route Frequency Provider Last Rate Last Dose  . acetaminophen (TYLENOL) tablet 650 mg  650 mg Per Tube Q6H PRN Sudini, Wardell HeathSrikar, MD      . albuterol (PROVENTIL) (2.5 MG/3ML) 0.083% nebulizer solution 2.5 mg  2.5 mg Nebulization Q2H PRN Sudini, Srikar, MD      . ALPRAZolam Prudy Feeler(XANAX) tablet 0.5 mg  0.5 mg Per Tube BID PRN Milagros LollSudini, Srikar, MD      . baclofen (LIORESAL) tablet 10 mg  10 mg Per Tube Daily Milagros LollSudini, Srikar, MD   10 mg at 08/01/18 1143  . baclofen (LIORESAL) tablet 20 mg  20 mg Per Tube QHS Sudini, Srikar, MD      . ceFEPIme (MAXIPIME) 1 g in sodium chloride 0.9 % 100 mL IVPB  1 g Intravenous Q8H Sudini, Srikar, MD      . diphenhydrAMINE (BENADRYL) 12.5 MG/5ML elixir 25 mg  25 mg Per Tube Q8H PRN Milagros LollSudini, Srikar, MD   25 mg at 08/01/18 1144  . docusate (COLACE) 50 MG/5ML liquid 200 mg  200 mg Per Tube QHS Sudini, Srikar, MD      . doxepin (SINEQUAN) capsule 10 mg  10 mg Per Tube QHS Milagros LollSudini, Srikar, MD      .  ferrous sulfate 220 (44 Fe) MG/5ML solution 325 mg  325 mg Per Tube TID WC Milagros LollSudini, Srikar, MD   325 mg at 08/01/18 1142  . FLUoxetine (PROZAC) 20 MG/5ML solution 20 mg  20 mg Per Tube Daily Sudini, Srikar, MD   20 mg at 08/01/18 1144  . furosemide (LASIX) tablet 20 mg  20 mg Per Tube Daily Milagros LollSudini, Srikar, MD   20 mg at 08/01/18 1143  . HYDROcodone-acetaminophen (NORCO) 10-325 MG per tablet 1 tablet  1 tablet Per Tube Q6H PRN Milagros LollSudini, Srikar, MD   1 tablet at 08/01/18 1143  . ipratropium-albuterol (DUONEB) 0.5-2.5 (3) MG/3ML nebulizer solution 3 mL  3 mL Nebulization Q6H Sudini, Srikar, MD   3 mL at 08/01/18 1402  . loratadine (CLARITIN) tablet 10 mg  10 mg Per Tube Daily Milagros LollSudini, Srikar, MD   10 mg at 08/01/18 1143  . Melatonin TABS  2.5 mg  2.5 mg Per Tube QHS Sudini, Srikar, MD      . metroNIDAZOLE (FLAGYL) IVPB 500 mg  500 mg Intravenous Q8H Sudini, Srikar, MD 100 mL/hr at 08/01/18 1309 500 mg at 08/01/18 1309  . ondansetron (ZOFRAN) tablet 4 mg  4 mg Oral Q6H PRN Milagros LollSudini, Srikar, MD       Or  . ondansetron (ZOFRAN) injection 4 mg  4 mg Intravenous Q6H PRN Sudini, Srikar, MD      . pantoprazole sodium (PROTONIX) 40 mg/20 mL oral suspension 40 mg  40 mg Per Tube BID Milagros LollSudini, Srikar, MD   40 mg at 08/01/18 1143  . polyethylene glycol (MIRALAX / GLYCOLAX) packet 17 g  17 g Per Tube Daily Milagros LollSudini, Srikar, MD   17 g at 08/01/18 1142  . sennosides (SENOKOT) 8.8 MG/5ML syrup 10 mL  10 mL Per Tube QHS Sudini, Srikar, MD      . sodium chloride flush (NS) 0.9 % injection 3 mL  3 mL Intravenous Q12H Sudini, Srikar, MD   3 mL at 08/01/18 1146     Discharge Medications: Please see discharge summary for a list of discharge medications.  Relevant Imaging Results:  Relevant Lab Results:   Additional Information (SSN: [id number])  Fatina Sprankle, Darleen CrockerBailey M, LCSW

## 2018-08-01 NOTE — Consult Note (Signed)
Pharmacy Antibiotic Note  Alexander Escobar is a 57 y.o. male admitted on 08/01/2018 with aspiration pneumonia.  Pharmacy has been consulted for Cefepime + Flagyl dosing.  Plan: Cefepime 1 gram every 8 hours  Flagyl 500 mg every 8 hours  Weight: 151 lb 0.2 oz (68.5 kg)  Temp (24hrs), Avg:97.7 F (36.5 C), Min:97.5 F (36.4 C), Max:97.9 F (36.6 C)  Recent Labs  Lab 08/01/18 0618  WBC 20.0*  CREATININE 1.14  LATICACIDVEN 1.8    Estimated Creatinine Clearance: 66.1 mL/min (by C-G formula based on SCr of 1.14 mg/dL).    Allergies  Allergen Reactions  . Ciprofloxacin Swelling  . Contrast Media [Iodinated Diagnostic Agents] Other (See Comments)    Constipation   . Penicillins Rash    Has patient had a PCN reaction causing immediate rash, facial/tongue/throat swelling, SOB or lightheadedness with hypotension: Unknown Has patient had a PCN reaction causing severe rash involving mucus membranes or skin necrosis: Unknown Has patient had a PCN reaction that required hospitalization: Unknown Has patient had a PCN reaction occurring within the last 10 years: Unknown If all of the above answers are "NO", then may proceed with Cephalosporin use.     Antimicrobials this admission: Cefepime 0815 >>  Flagyl 0815 >>     Microbiology results: 0815 BCx: pending 0815 Sputum: pending    Thank you for allowing pharmacy to be a part of this patient's care.  Mauri ReadingSavanna M Jolicia Delira, PharmD Pharmacy Resident  08/01/2018 10:35 AM

## 2018-08-01 NOTE — H&P (Addendum)
[employer] [facility] - [place] [facility]   PATIENT NAME: Alexander Escobar    MR#:  [id number]  DATE OF BIRTH:  05/16/1961  DATE OF ADMISSION:  08/01/2018  PRIMARY CARE PHYSICIAN: Charlott Rakes, MD   REQUESTING/REFERRING PHYSICIAN: Dr. Cyril Loosen  CHIEF COMPLAINT:   Chief Complaint  Patient presents with  . Respiratory Distress    HISTORY OF PRESENT ILLNESS:  Alexander Escobar  is a 57 y.o. male with a known history of Dysphagia, PEG, Quadriplegia here for SOB and weakness.  Patient placed on nonrebreather initially and later transitioned to 3 L oxygen.  Patient has chronic dysphagia with quadriplegia and gets tube feeds through PEG tube.  Patient continues to complain of shortness of breath.  No sputum.  He has had recurrent melena alternating with regular stools for the past few days.  No abdominal pain.  Tolerating tube feeds.  Afebrile.  Found to have sepsis in the emergency room.  She  PAST MEDICAL HISTORY:   Past Medical History:  Diagnosis Date  . Congenital absence of external auditory canal   . Gastroesophageal reflux disease   . Presence of permanent cardiac pacemaker   . Quadriplegia (HCC)   . Recurrent major depression (HCC)   . Severe protein-calorie malnutrition (HCC)     PAST SURGICAL HISTORY:   Past Surgical History:  Procedure Laterality Date  . ESOPHAGOGASTRODUODENOSCOPY N/A 05/19/2018   Procedure: ESOPHAGOGASTRODUODENOSCOPY (EGD);  Surgeon: Toney Reil, MD;  Location: [facility] ENDOSCOPY;  Service: Gastroenterology;  Laterality: N/A;  . PEG TUBE PLACEMENT    . SUPRAPUBIC CATHETER INSERTION      SOCIAL HISTORY:   Social History   Tobacco Use  . Smoking status: Former Games developer  . Smokeless tobacco: Never Used  Substance Use Topics  . Alcohol use: No    FAMILY HISTORY:   Family History  Problem Relation Age of Onset  . CAD Mother   . CAD Father     DRUG ALLERGIES:   Allergies  Allergen Reactions  . Ciprofloxacin Swelling  .  Contrast Media [Iodinated Diagnostic Agents] Other (See Comments)    Constipation   . Penicillins Rash    Has patient had a PCN reaction causing immediate rash, facial/tongue/throat swelling, SOB or lightheadedness with hypotension: Unknown Has patient had a PCN reaction causing severe rash involving mucus membranes or skin necrosis: Unknown Has patient had a PCN reaction that required hospitalization: Unknown Has patient had a PCN reaction occurring within the last 10 years: Unknown If all of the above answers are "NO", then may proceed with Cephalosporin use.     REVIEW OF SYSTEMS:   Review of Systems  Constitutional: Positive for malaise/fatigue. Negative for chills and fever.  HENT: Negative for sore throat.   Eyes: Negative for blurred vision, double vision and pain.  Respiratory: Positive for cough and shortness of breath. Negative for hemoptysis and wheezing.   Cardiovascular: Negative for chest pain, palpitations, orthopnea and leg swelling.  Gastrointestinal: Negative for abdominal pain, constipation, diarrhea, heartburn, nausea and vomiting.  Genitourinary: Negative for dysuria and hematuria.  Musculoskeletal: Negative for back pain and joint pain.  Skin: Negative for rash.  Neurological: Negative for sensory change, speech change, focal weakness and headaches.  Endo/Heme/Allergies: Does not bruise/bleed easily.  Psychiatric/Behavioral: Negative for depression. The patient is not nervous/anxious.     MEDICATIONS AT HOME:   Prior to Admission medications   Medication Sig Start Date End Date Taking? Authorizing Provider  acetaminophen (TYLENOL) 325 MG tablet Take 650 mg by  mouth every 6 (six) hours as needed for mild pain or moderate pain.   Yes [provider]  ALPRAZolam Prudy Feeler(XANAX) 0.5 MG tablet Place 1 tablet (0.5 mg total) into feeding tube at bedtime. 05/19/18  Yes Gouru, Deanna ArtisAruna, MD  Amino Acids-Protein Hydrolys (FEEDING SUPPLEMENT, PRO-STAT SUGAR FREE 64,) LIQD  Place 30 mLs into feeding tube 3 (three) times daily. Patient taking differently: Place 30 mLs into feeding tube daily.  07/08/17  Yes Enedina FinnerPatel, Sona, MD  ascorbic acid (VITAMIN C) 500 MG tablet Place 500 mg into feeding tube daily.   Yes [provider]  baclofen (LIORESAL) 10 MG tablet Place 1-2 tablets (10-20 mg total) into feeding tube 2 (two) times daily. Take 10 mg by mouth in the morning and 20 mg by mouth at bedtime. 05/19/18  Yes Gouru, Deanna ArtisAruna, MD  bisacodyl (DULCOLAX) 10 MG suppository Place 10 mg rectally daily.   Yes [provider]  diphenhydrAMINE (BENADRYL) 12.5 MG/5ML elixir Place 10 mLs (25 mg total) into feeding tube every 8 (eight) hours as needed for allergies. 05/19/18  Yes Gouru, Deanna ArtisAruna, MD  docusate (COLACE) 50 MG/5ML liquid Place 200 mg into feeding tube at bedtime.   Yes [provider]  doxepin (SINEQUAN) 10 MG capsule Place 10 mg into feeding tube at bedtime.    Yes [provider]  Ferrous Sulfate 5 MG/20ML LIQD 325 mg by PEG Tube route 3 (three) times daily with meals. 05/19/18 08/20/18 Yes Gouru, Deanna ArtisAruna, MD  FLUoxetine (PROZAC) 20 MG/5ML solution Place 20 mg into feeding tube daily.    Yes [provider]  furosemide (LASIX) 20 MG tablet Place 20 mg into feeding tube daily.    Yes [provider]  HYDROcodone-acetaminophen (NORCO) 10-325 MG tablet Place 1 tablet into feeding tube every 6 (six) hours as needed for moderate pain. 05/19/18  Yes Gouru, Aruna, MD  ipratropium-albuterol (DUONEB) 0.5-2.5 (3) MG/3ML SOLN Take 3 mLs by nebulization every 4 (four) hours as needed (shortness of breath).    Yes [provider]  loratadine (CLARITIN) 10 MG tablet Place 10 mg into feeding tube daily.   Yes [provider]  magnesium hydroxide (MILK OF MAGNESIA) 400 MG/5ML suspension Place 30 mLs into feeding tube 2 (two) times daily as needed for mild constipation.    Yes [provider]  Melatonin 3 MG TABS Place 1 tablet  into feeding tube at bedtime.    Yes [provider]  Misc Natural Products (OSTEO BI-FLEX ADV JOINT SHIELD PO) 1 tablet by PEG Tube route daily.    Yes [provider]  Multiple Vitamin (MULTIVITAMIN) LIQD Place 15 mLs into feeding tube daily.    Yes [provider]  Nutritional Supplements (FEEDING SUPPLEMENT, JEVITY 1.2 CAL,) LIQD Place 1,000 mLs into feeding tube continuous. 05/19/18  Yes Gouru, Deanna ArtisAruna, MD  pantoprazole sodium (PROTONIX) 40 mg/20 mL PACK Place 20 mLs (40 mg total) into feeding tube 2 (two) times daily. 05/19/18  Yes Gouru, Aruna, MD  polyethylene glycol (MIRALAX / GLYCOLAX) packet Place 17 g into feeding tube daily.    Yes [provider]  ranitidine (ZANTAC) 150 MG tablet Take 150 mg by mouth 2 (two) times daily.   Yes [provider]  sennosides (SENOKOT) 8.8 MG/5ML syrup Place 10 mLs into feeding tube at bedtime.   Yes [provider]  shark liver oil-cocoa butter (PREPARATION H) 0.25-3-85.5 % suppository Place 1 suppository rectally every 12 (twelve) hours as needed (rectal bleeding).   Yes [provider]     VITAL SIGNS:  Blood pressure 117/77, pulse 100, temperature 97.9 F (36.6 C), temperature source Oral, resp. rate 20, weight 68.5 kg, SpO2 99 %.  PHYSICAL EXAMINATION:  Physical Exam  GENERAL:  57 y.o.-year-old patient lying in the bed with resp distress EYES: Pupils equal, round, reactive to light and accommodation. No scleral icterus. Extraocular muscles intact.  HEENT: Head atraumatic, normocephalic. Oropharynx and nasopharynx clear. No oropharyngeal erythema, moist oral mucosa  NECK:  Supple, no jugular venous distention. No thyroid enlargement, no tenderness.  LUNGS: Increased work of breathing. Bilateral coarse breath sounds CARDIOVASCULAR: S1, S2 normal. No murmurs, rubs, or gallops.  ABDOMEN: Soft, nontender, nondistended. PEG in place EXTREMITIES: No pedal edema, cyanosis, or clubbing. + 2 pedal  & radial pulses b/l.   NEUROLOGIC: Quadriplegia with diffuse muscle wasting PSYCHIATRIC: The patient is alert and oriented x 3. Anxious SKIN: No obvious rash, lesion, or ulcer.   LABORATORY PANEL:   CBC Recent Labs  Lab 08/01/18 0618  WBC 20.0*  HGB 6.8*  HCT 21.2*  PLT 846*   ------------------------------------------------------------------------------------------------------------------  Chemistries  Recent Labs  Lab 08/01/18 0618  NA 137  K 4.0  CL 102  CO2 27  GLUCOSE 119*  BUN 34*  CREATININE 1.14  CALCIUM 8.0*  AST 30  ALT 24  ALKPHOS 91  BILITOT 0.4   ------------------------------------------------------------------------------------------------------------------  Cardiac Enzymes Recent Labs  Lab 08/01/18 0618  TROPONINI <0.03   ------------------------------------------------------------------------------------------------------------------  RADIOLOGY:  Dg Chest Portable 1 View  Result Date: 08/01/2018 CLINICAL DATA:  Respiratory distress this morning with periods of apnea. EXAM: PORTABLE CHEST 1 VIEW COMPARISON:  07/03/2017 FINDINGS: Cardiac pacemaker. Shallow inspiration. Cardiac enlargement with pulmonary vascular congestion. Large left pleural effusion. Probable small right pleural effusion. Infiltration or atelectasis in the left lung base. Mild perihilar infiltration or edema. No pneumothorax. Old right rib fractures. IMPRESSION: Cardiac enlargement with pulmonary vascular congestion. Bilateral pleural effusions, greater on the left. Infiltration or atelectasis in the left lung base. Perihilar edema. Electronically Signed   By: Burman NievesWilliam  Stevens M.D.   On: 08/01/2018 06:38   IMPRESSION AND PLAN:   * Bilateral pneumonia with acute hypoxic respiratory failure sepsis present on admission Most likely aspiration pneumonia due to patient's dysphagia.  We will start cefepime and Flagyl.  Culture sent and pending.  IV fluids.  Nebulizers as needed.  *  Worsening anemia over chronic anemia.  Likely GI losses with history of melena We will start Protonix. 1 unit packed RBC transfusion. Consult GI  * Dysphagia with PEG tube in place.  Resume tube feeds.  *DVT prophylaxis with SCDs  All the records are reviewed and case discussed with ED provider. Management plans discussed with the patient, family and they are in agreement.  CODE STATUS: DNR/DNI  TOTAL CC TIME TAKING CARE OF THIS PATIENT: 60 minutes.   Discussed with patient and healthcare power of attorney at bedside regarding patient's chronic bedbound status, quadriplegia.  Increased risk of aspiration even being n.p.o. and getting tube feeds.  Patient has had aspiration pneumonia in the past.  Presently patient wanting aggressive treatment other than being DO NOT RESUSCITATE and DO NOT INTUBATE.  Discussed with POA.  Orders entered.  Additional time spent 20 minutes.  Orie FishermanSrikar R Parneet Glantz M.D on 08/01/2018 at 1:18 PM  Between 7am to 6pm - Pager - [phone]  After 6pm go to [email or web address] - password [email or web address] [facility]  [facility] [place] [facility]  Office  [phone]  CC: Primary care physician; Charlott RakesHodges, Francisco, MD  Note: This dictation was prepared with Dragon dictation along with smaller phrase technology. Any transcriptional errors that result from this process are unintentional.

## 2018-08-01 NOTE — Progress Notes (Signed)
Sputum sample sent to lab. RN aware.

## 2018-08-01 NOTE — Clinical Social Work Note (Signed)
Clinical Social Work Assessment  Patient Details  Name: Alexander Escobar MRN: [id number] Date of Birth: 05-03-1961  Date of referral:  08/01/18               Reason for consult:  Other (Comment Required)(From [facility] L[facility] )                Permission sought to share information with:  Chartered certified accountant granted to share information::  Yes, Verbal Permission Granted  Name::      Alexander Escobar::     Relationship::     Contact Information:     Housing/Transportation Living arrangements for the past 2 months:  [place] of Information:  Patient, Medical laboratory scientific officer Patient Interpreter Needed:  None Criminal Activity/Legal Involvement Pertinent to Current Situation/Hospitalization:  No - Comment as needed Significant Relationships:  Other Family Members Lives with:  Facility Resident Do you feel safe going back to the place where you live?  Yes Need for family participation in patient care:  Yes (Comment)  Care giving concerns:  Patient is a long term care resident at [facility].    Social Worker assessment / plan:  Holiday representative ([place]) reviewed chart and noted that patient is from [facility]. Per [facility] admissions coordinator at [facility] patient is a long term care resident and can return when stable. CSW met with patient and his cousin Alexander Escobar and Robert's wife Alexander Escobar were at bedside. Alexander Escobar and Alexander Escobar are patient's HPOAs. Patient, Alexander Escobar and Alexander Escobar are agreeable for patient to return to [facility] when medically stable. FL2 complete. CSW will continue to follow and assist as needed.   Employment status:  Disabled (Comment on whether or not currently receiving Disability) Insurance information:  Medicaid [place] PT Recommendations:  Not assessed at this time Information / Referral to community resources:  [place]  Patient/Family's Response to  care:  Patient is agreeable to D/C back to [facility].   Patient/Family's Understanding of and Emotional Response to Diagnosis, Current Treatment, and Prognosis:  Patient and his cousins were very pleasant and thanked CSW for assistance.   Emotional Assessment Appearance:  Appears stated age Attitude/Demeanor/Rapport:    Affect (typically observed):  Accepting, Adaptable, Pleasant Orientation:  Oriented to Self, Oriented to Place, Oriented to Situation, Oriented to  Time Alcohol / Substance use:  Not Applicable Psych involvement (Current and /or in the community):  No (Comment)  Discharge Needs  Concerns to be addressed:  Discharge Planning Concerns Readmission within the last 30 days:  No Current discharge risk:  Dependent with Mobility Barriers to Discharge:  Continued Medical Work up   [employer], Veronia Beets, LCSW 08/01/2018, 4:23 PM

## 2018-08-02 LAB — CBC
HCT: 23.8 % — ABNORMAL LOW (ref 40.0–52.0)
Hemoglobin: 7.9 g/dL — ABNORMAL LOW (ref 13.0–18.0)
MCH: 27.5 pg (ref 26.0–34.0)
MCHC: 33.3 g/dL (ref 32.0–36.0)
MCV: 82.4 fL (ref 80.0–100.0)
PLATELETS: 787 10*3/uL — AB (ref 150–440)
RBC: 2.89 MIL/uL — ABNORMAL LOW (ref 4.40–5.90)
RDW: 18.1 % — AB (ref 11.5–14.5)
WBC: 21.6 10*3/uL — ABNORMAL HIGH (ref 3.8–10.6)

## 2018-08-02 LAB — BPAM RBC
Blood Product Expiration Date: 201909012359
ISSUE DATE / TIME: 201908150917
Unit Type and Rh: 6200

## 2018-08-02 LAB — COMPREHENSIVE METABOLIC PANEL
ALBUMIN: 1.8 g/dL — AB (ref 3.5–5.0)
ALK PHOS: 88 U/L (ref 38–126)
ALT: 20 U/L (ref 0–44)
AST: 27 U/L (ref 15–41)
Anion gap: 5 (ref 5–15)
BILIRUBIN TOTAL: 0.7 mg/dL (ref 0.3–1.2)
BUN: 27 mg/dL — AB (ref 6–20)
CALCIUM: 7.9 mg/dL — AB (ref 8.9–10.3)
CO2: 27 mmol/L (ref 22–32)
CREATININE: 0.96 mg/dL (ref 0.61–1.24)
Chloride: 110 mmol/L (ref 98–111)
GFR calc Af Amer: >60 mL/min (ref >60)
GFR calc non Af Amer: >60 mL/min (ref >60)
Glucose, Bld: 94 mg/dL (ref 70–99)
Potassium: 3.5 mmol/L (ref 3.5–5.1)
SODIUM: 142 mmol/L (ref 135–145)
TOTAL PROTEIN: 6.4 g/dL — AB (ref 6.5–8.1)

## 2018-08-02 LAB — TYPE AND SCREEN
ABO/RH(D): A POS
ANTIBODY SCREEN: NEGATIVE
Unit division: 0

## 2018-08-02 MED ORDER — VANCOMYCIN HCL IN DEXTROSE 1-5 GM/200ML-% IV SOLN
1000.0000 mg | INTRAVENOUS | Status: DC
  Administered 2018-08-03 – 2018-08-06 (×4): 1000 mg via INTRAVENOUS
  Filled 2018-08-02 (×5): qty 200

## 2018-08-02 MED ORDER — VANCOMYCIN HCL IN DEXTROSE 1-5 GM/200ML-% IV SOLN
1000.0000 mg | Freq: Once | INTRAVENOUS | Status: AC
  Administered 2018-08-02: 17:00:00 1000 mg via INTRAVENOUS
  Filled 2018-08-02: qty 200

## 2018-08-02 MED ORDER — MUPIROCIN 2 % EX OINT: 1.0000 "application " | TOPICAL_OINTMENT | Freq: Two times a day (BID) | CUTANEOUS | Status: DC

## 2018-08-02 MED ORDER — JEVITY 1.5 CAL/FIBER PO LIQD
240.0000 mL | Freq: Four times a day (QID) | ORAL | Status: DC
  Administered 2018-08-02: 23:00:00 240 mL

## 2018-08-02 MED ORDER — OSMOLITE 1.5 CAL PO LIQD: 1000.0000 mL | ORAL | Status: DC

## 2018-08-02 MED ORDER — BISACODYL 10 MG RE SUPP
10.0000 mg | Freq: Every day | RECTAL | Status: DC | PRN
  Filled 2018-08-02: qty 1

## 2018-08-02 MED ORDER — PRO-STAT SUGAR FREE PO LIQD
30.0000 mL | Freq: Two times a day (BID) | ORAL | Status: DC
  Administered 2018-08-02 – 2018-08-03 (×2): 30 mL

## 2018-08-02 MED ORDER — JEVITY 1.2 CAL PO LIQD: 1000.0000 mL | ORAL | Status: DC

## 2018-08-02 MED ORDER — CHLORHEXIDINE GLUCONATE CLOTH 2 % EX PADS: 6.0000 | MEDICATED_PAD | Freq: Every day | CUTANEOUS | Status: DC

## 2018-08-02 NOTE — Progress Notes (Signed)
Clinical Social Worker (CSW) received a consult that patient is refusing interventions to improve his healthcare. CSW discussed case with RN. Patient is asking for apple juice and his caregiver Elnita MaxwellCheryl is giving it to him however he is NPO. CSW asked MD to order palliative consult to discuss goals of care. CSW will continue to follow and assist as needed.   Baker Hughes IncorporatedBailey Saul Dorsi, LCSW [phone]
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
## 2018-08-02 NOTE — Progress Notes (Addendum)
Please note patient is currently followed by out patient [facility]  CSW [facility] Sample and attending Dr. Elpidio AnisSudini. No d/c date at this time.  Dayna BarkerKaren Robertson RN, BSN, [facility]. Hospital Liaison [phone]

## 2018-08-02 NOTE — Progress Notes (Addendum)
CPT performed with manual percussor primarily on right side. Patient has pacemaker on left. BBS noted to be rhonci and diminished. Assisted patient with cough assist maneuver afterwards. Gentle pressing on sternum as he took deep breaths and coughed x2. Patient performed x2. Able to cough up thick yellow white secretions x2. Tolerated well. Relative at bedside to witness process. I asked relative if they were aware of cough assist device that would aid so that no one would have to physically apply pressure to patient since he has a peg tube as well as pacemaker. Relative stated he would look into such and have main caretaker to discuss with md. Such would be very beneficial for this patient if covered by his insurance.

## 2018-08-02 NOTE — Progress Notes (Signed)
[employer] [facility] - [place] at [facility]   PATIENT NAME: Alexander CofferJames Escobar    MR#:  [id number]  DATE OF BIRTH:  01/04/1961  SUBJECTIVE:  CHIEF COMPLAINT:   Chief Complaint  Patient presents with  . Respiratory Distress   Off oxygen today.  Afebrile.  Feels better.  Tolerating tube feeds.  REVIEW OF SYSTEMS:    Review of Systems  Constitutional: Positive for chills and malaise/fatigue. Negative for fever.  HENT: Negative for sore throat.   Eyes: Negative for blurred vision, double vision and pain.  Respiratory: Negative for cough, hemoptysis, shortness of breath and wheezing.   Cardiovascular: Negative for chest pain, palpitations, orthopnea and leg swelling.  Gastrointestinal: Negative for abdominal pain, constipation, diarrhea, heartburn, nausea and vomiting.  Genitourinary: Negative for dysuria and hematuria.  Musculoskeletal: Positive for back pain. Negative for joint pain.  Skin: Negative for rash.  Neurological: Negative for sensory change, speech change, focal weakness and headaches.  Endo/Heme/Allergies: Does not bruise/bleed easily.  Psychiatric/Behavioral: Negative for depression. The patient is not nervous/anxious.     DRUG ALLERGIES:   Allergies  Allergen Reactions  . Ciprofloxacin Swelling  . Contrast Media [Iodinated Diagnostic Agents] Other (See Comments)    Constipation   . Penicillins Rash    Has patient had a PCN reaction causing immediate rash, facial/tongue/throat swelling, SOB or lightheadedness with hypotension: Unknown Has patient had a PCN reaction causing severe rash involving mucus membranes or skin necrosis: Unknown Has patient had a PCN reaction that required hospitalization: Unknown Has patient had a PCN reaction occurring within the last 10 years: Unknown If all of the above answers are "NO", then may proceed with Cephalosporin use.     VITALS:  Blood pressure 117/69, pulse 80, temperature 99 F (37.2 C), temperature source Oral,  resp. rate 20, height 5\' 6"  (1.676 m), weight 69 kg, SpO2 (!) 88 %.  PHYSICAL EXAMINATION:   Physical Exam  GENERAL:  57 y.o.-year-old patient lying in the bed with no acute distress.  EYES: Pupils equal, round, reactive to light and accommodation. No scleral icterus. Extraocular muscles intact.  HEENT: Head atraumatic, normocephalic. Oropharynx and nasopharynx clear.  NECK:  Supple, no jugular venous distention. No thyroid enlargement, no tenderness.  LUNGS: Bilateral decreased breath sounds CARDIOVASCULAR: S1, S2 normal. No murmurs, rubs, or gallops.  ABDOMEN: Soft, nontender, nondistended. Bowel sounds present. No organomegaly or mass.  PEG tube in place EXTREMITIES: No cyanosis, clubbing or edema b/l.   Diffuse muscle wasting NEUROLOGIC: Quadriplegia. PSYCHIATRIC: The patient is alert and oriented x 3.  SKIN: Sacral decubitus ulcer  LABORATORY PANEL:   CBC Recent Labs  Lab 08/02/18 0410  WBC 21.6*  HGB 7.9*  HCT 23.8*  PLT 787*   ------------------------------------------------------------------------------------------------------------------ Chemistries  Recent Labs  Lab 08/02/18 0410  NA 142  K 3.5  CL 110  CO2 27  GLUCOSE 94  BUN 27*  CREATININE 0.96  CALCIUM 7.9*  AST 27  ALT 20  ALKPHOS 88  BILITOT 0.7   ------------------------------------------------------------------------------------------------------------------  Cardiac Enzymes Recent Labs  Lab 08/01/18 0618  TROPONINI <0.03   ------------------------------------------------------------------------------------------------------------------  RADIOLOGY:  Dg Chest Portable 1 View  Result Date: 08/01/2018 CLINICAL DATA:  Respiratory distress this morning with periods of apnea. EXAM: PORTABLE CHEST 1 VIEW COMPARISON:  07/03/2017 FINDINGS: Cardiac pacemaker. Shallow inspiration. Cardiac enlargement with pulmonary vascular congestion. Large left pleural effusion. Probable small right pleural  effusion. Infiltration or atelectasis in the left lung base. Mild perihilar infiltration or edema. No pneumothorax. Old right rib  fractures. IMPRESSION: Cardiac enlargement with pulmonary vascular congestion. Bilateral pleural effusions, greater on the left. Infiltration or atelectasis in the left lung base. Perihilar edema. Electronically Signed   By: Burman NievesWilliam  Stevens M.D.   On: 08/01/2018 06:38     ASSESSMENT AND PLAN:   * Bilateral pneumonia with acute hypoxic respiratory failure sepsis present on admission Most likely aspiration pneumonia due to patient's dysphagia. Patient on cefepime, Flagyl.  Vancomycin added due to gram-positive cocci in sputum and MRSA PCR positive. Wait for final cultures.  WBC continues to be high.  * Worsening anemia over chronic anemia.  GI consulted due to history of melena intermittently.  Recommended no endoscopy procedures at this time and anemia start to be likely from chronic disease.  * Dysphagia with PEG tube in place.  Resumed tube feeds.  All the records are reviewed and case discussed with Care Management/Social Worker Management plans discussed with the patient, family and they are in agreement.  CODE STATUS: DNR  DVT Prophylaxis: SCDs  TOTAL TIME TAKING CARE OF THIS PATIENT: 35 minutes.   Likely discharge tomorrow back to skilled nursing facility  Orie FishermanSrikar R Zakiyah Diop M.D on 08/02/2018 at 2:45 PM  Between 7am to 6pm - Pager - [phone]  After 6pm go to [email or web address] - password [email or web address] [facility]  [facility] [place] [facility]  Office  [phone]  CC: Primary care physician; Charlott RakesHodges, Francisco, MD  Note: This dictation was prepared with Dragon dictation along with smaller phrase technology. Any transcriptional errors that result from this process are unintentional.

## 2018-08-02 NOTE — Plan of Care (Signed)

## 2018-08-02 NOTE — Consult Note (Signed)
Pharmacy Antibiotic Note  Deanne CofferJames Spray is a 57 y.o. male admitted on 08/01/2018 with aspiration pneumonia.  Pharmacy has been consulted for Cefepime + Flagyl + Vancomycin dosing. Patient is quadriplegic.  Plan:  Initiate Vancomycin 1000 mg every 24 hours  due to gram-positive cocci in sputum and MRSA PCR positive. Continue Cefepime 1 gram every 8 hours Continue Flagyl 500 mg every 8 hours  Patient received Vancomycin 1 gram q 12 hours in July 2018; trough drawn appropriately resulted in Vanc trough ~37. Due to quadriplegia, renal function is likely overestimated. Did not do stack dosing. Will check VL prior to 5th total dose (0820 @ 1530).  Estimated PK ke 0.038 T1/2 18.2 hours Vd 48.4 ~CrCl 40 mL/min   Height: 5\' 6"  (167.6 cm) Weight: 152 lb 1.9 oz (69 kg) IBW/kg (Calculated) : 63.8  Temp (24hrs), Avg:99 F (37.2 C), Min:98.5 F (36.9 C), Max:100.4 F (38 C)  Recent Labs  Lab 08/01/18 0618 08/02/18 0410  WBC 20.0* 21.6*  CREATININE 1.14 0.96  LATICACIDVEN 1.8  --     Estimated Creatinine Clearance: 78.5 mL/min (by C-G formula based on SCr of 0.96 mg/dL).    Allergies  Allergen Reactions  . Ciprofloxacin Swelling  . Contrast Media [Iodinated Diagnostic Agents] Other (See Comments)    Constipation   . Penicillins Rash    Has patient had a PCN reaction causing immediate rash, facial/tongue/throat swelling, SOB or lightheadedness with hypotension: Unknown Has patient had a PCN reaction causing severe rash involving mucus membranes or skin necrosis: Unknown Has patient had a PCN reaction that required hospitalization: Unknown Has patient had a PCN reaction occurring within the last 10 years: Unknown If all of the above answers are "NO", then may proceed with Cephalosporin use.     Antimicrobials this admission: Cefepime 0815 >>  Vancomycin 0816 >> Flagyl 0815 >>     Microbiology results: 0815 BCx: pending 0815 Sputum: WBC present, few gram + cocci MRSA PCR  +  Thank you for allowing pharmacy to be a part of this patient's care.  Pharmacy will continue to monitor.  Mauri ReadingSavanna M Martin, PharmD Pharmacy Resident  08/02/2018 3:31 PM

## 2018-08-02 NOTE — Consult Note (Signed)
WOC Nurse wound consult note Reason for Consult:stage 4 pressure injury sacrum, chronic nonhealing.  Wound type:full thickness pressure injury Pressure Injury POA: Yes Measurement: 2 cm x 1.5 cm x 2 cm  Wound WNU:UVOZDGbed:unable to visualize  Drainage (amount, consistency, odor) moderate serosanguinous with musty odor Periwound:intact  Incontinent of stool Dressing procedure/placement/frequency:Cleanse sacral wound with NS.  Gently fill wound depth with Aquacel Ag.  Cover with ABD pad and tape.  Change daily.  Will not follow at this time.  Please re-consult if needed.  Maple HudsonKaren Grigor Lipschutz RN BSN CWON Pager [phone]

## 2018-08-02 NOTE — Plan of Care (Signed)

## 2018-08-02 NOTE — Progress Notes (Signed)
Plan is for patient to D/C back to [facility] when medically stable. [facility] is aware of above.   Baker Hughes IncorporatedBailey Andon Villard, LCSW [phone]

## 2018-08-03 ENCOUNTER — Inpatient Hospital Stay: Payer: Medicaid Other

## 2018-08-03 LAB — URINE CULTURE
CULTURE: NO GROWTH
Special Requests: NORMAL

## 2018-08-03 LAB — GLUCOSE, CAPILLARY
GLUCOSE-CAPILLARY: 89 mg/dL (ref 70–99)
Glucose-Capillary: 97 mg/dL (ref 70–99)

## 2018-08-03 LAB — TROPONIN I

## 2018-08-03 LAB — HIV ANTIBODY (ROUTINE TESTING W REFLEX): HIV Screen 4th Generation wRfx: NONREACTIVE

## 2018-08-03 NOTE — Progress Notes (Signed)
Pt refusing blood sugars this shift. Henriette CombsSarah Naseem Adler RN

## 2018-08-03 NOTE — Progress Notes (Signed)
CPT & assisted coughing refused.

## 2018-08-03 NOTE — Progress Notes (Signed)
Made Md Caryn BeeMaier aware that pt was uncompliant and  refusing  a lot of his meds,and  care Pt states "he wants 1/2 dose of this did not want that and refused his BS checks and etc.  Pt states that he wants to do everything like he did at the nursing home. Where pt  was from educated and explained to pt that the MD wrote his orders the way that was the best for him. He stated "He did not care he had his rights to refused what he did not want"  Will continue to monitor to prevent injuries..Marland Kitchen

## 2018-08-03 NOTE — Progress Notes (Signed)
[employer] - [place] at [facility]   PATIENT NAME: Alexander CofferJames Escobar    MR#:  [id number]  DATE OF BIRTH:  09/04/1961  SUBJECTIVE:  CHIEF COMPLAINT:   Chief Complaint  Patient presents with  . Respiratory Distress   Off oxygen today.  Afebrile.  Feels better.  Tolerating tube feeds.  REVIEW OF SYSTEMS:    Review of Systems  Constitutional: Positive for chills and malaise/fatigue. Negative for fever.  HENT: Negative for sore throat.   Eyes: Negative for blurred vision, double vision and pain.  Respiratory: Negative for cough, hemoptysis, shortness of breath and wheezing.   Cardiovascular: Negative for chest pain, palpitations, orthopnea and leg swelling.  Gastrointestinal: Negative for abdominal pain, constipation, diarrhea, heartburn, nausea and vomiting.  Genitourinary: Negative for dysuria and hematuria.  Musculoskeletal: Positive for back pain. Negative for joint pain.  Skin: Negative for rash.  Neurological: Negative for sensory change, speech change, focal weakness and headaches.  Endo/Heme/Allergies: Does not bruise/bleed easily.  Psychiatric/Behavioral: Negative for depression. The patient is not nervous/anxious.     DRUG ALLERGIES:   Allergies  Allergen Reactions  . Ciprofloxacin Swelling  . Contrast Media [Iodinated Diagnostic Agents] Other (See Comments)    Constipation   . Penicillins Rash    Has patient had a PCN reaction causing immediate rash, facial/tongue/throat swelling, SOB or lightheadedness with hypotension: Unknown Has patient had a PCN reaction causing severe rash involving mucus membranes or skin necrosis: Unknown Has patient had a PCN reaction that required hospitalization: Unknown Has patient had a PCN reaction occurring within the last 10 years: Unknown If all of the above answers are "NO", then may proceed with Cephalosporin use.     VITALS:  Blood pressure 108/66, pulse 76, temperature 98.3 F (36.8 C), temperature source Oral,  resp. rate 20, height 5\' 6"  (1.676 m), weight 69 kg, SpO2 90 %.  PHYSICAL EXAMINATION:   Physical Exam  GENERAL:  [age] y.o.-year-old patient lying in the bed with no acute distress.  EYES: Pupils equal, round, reactive to light and accommodation. No scleral icterus. Extraocular muscles intact.  HEENT: Head atraumatic, normocephalic. Oropharynx and nasopharynx clear.  NECK:  Supple, no jugular venous distention. No thyroid enlargement, no tenderness.  LUNGS: Bilateral decreased breath sounds CARDIOVASCULAR: S1, S2 normal. No murmurs, rubs, or gallops.  ABDOMEN: Soft, nontender, nondistended. Bowel sounds present. No organomegaly or mass.  PEG tube in place EXTREMITIES: No cyanosis, clubbing or edema b/l.   Diffuse muscle wasting NEUROLOGIC: Quadriplegia.  Atrophic limbs PSYCHIATRIC: The patient is alert and oriented x 3.  SKIN: Sacral decubitus ulcer  LABORATORY PANEL:   CBC Recent Labs  Lab 08/02/18 0410  WBC 21.6*  HGB 7.9*  HCT 23.8*  PLT 787*   ------------------------------------------------------------------------------------------------------------------ Chemistries  Recent Labs  Lab 08/02/18 0410  NA 142  K 3.5  CL 110  CO2 27  GLUCOSE 94  BUN 27*  CREATININE 0.96  CALCIUM 7.9*  AST 27  ALT 20  ALKPHOS 88  BILITOT 0.7   ------------------------------------------------------------------------------------------------------------------  Cardiac Enzymes Recent Labs  Lab 08/01/18 0618  TROPONINI <0.03   ------------------------------------------------------------------------------------------------------------------  RADIOLOGY:  No results found.   ASSESSMENT AND PLAN:   * Bilateral pneumonia with acute hypoxic respiratory failure sepsis present on admission Most likely aspiration pneumonia due to patient's dysphagia. Patient on cefepime, Flagyl.  Vancomycin added due to gram-positive cocci in sputum and MRSA PCR positive. Wait for final cultures.   WBC continues to be high.  * Worsening anemia over chronic anemia.  GI consulted due to history of melena intermittently.  Recommended no endoscopy procedures at this time and anemia start to be likely from chronic disease.  * Dysphagia with PEG tube in place.  Resumed tube feeds.  All the records are reviewed and case discussed with Care Management/Social Worker Management plans discussed with the patient, family and they are in agreement.  CODE STATUS: DNR  DVT Prophylaxis: SCDs  TOTAL TIME TAKING CARE OF THIS PATIENT: 35 minutes.   Likely discharge tomorrow back to skilled nursing facility  Altamese DillingVaibhavkumar Sudiksha Victor M.D on 08/03/2018 at 2:10 PM  Between 7am to 6pm - Pager - [phone]  After 6pm go to [email or web address] - password [email or web address] [facility]  [facility] [place] [facility]  Office  [phone]  CC: Primary care physician; Charlott RakesHodges, Francisco, MD  Note: This dictation was prepared with Dragon dictation along with smaller phrase technology. Any transcriptional errors that result from this process are unintentional.

## 2018-08-04 ENCOUNTER — Inpatient Hospital Stay: Payer: Medicaid Other

## 2018-08-04 LAB — BASIC METABOLIC PANEL
Anion gap: 6 (ref 5–15)
BUN: 25 mg/dL — AB (ref 6–20)
CO2: 26 mmol/L (ref 22–32)
CREATININE: 0.92 mg/dL (ref 0.61–1.24)
Calcium: 8 mg/dL — ABNORMAL LOW (ref 8.9–10.3)
Chloride: 115 mmol/L — ABNORMAL HIGH (ref 98–111)
GFR calc Af Amer: >60 mL/min (ref >60)
GFR calc non Af Amer: >60 mL/min (ref >60)
Glucose, Bld: 111 mg/dL — ABNORMAL HIGH (ref 70–99)
Potassium: 3.3 mmol/L — ABNORMAL LOW (ref 3.5–5.1)
SODIUM: 147 mmol/L — AB (ref 135–145)

## 2018-08-04 LAB — CBC
HCT: 23.2 % — ABNORMAL LOW (ref 40.0–52.0)
Hemoglobin: 7.8 g/dL — ABNORMAL LOW (ref 13.0–18.0)
MCH: 27.9 pg (ref 26.0–34.0)
MCHC: 33.7 g/dL (ref 32.0–36.0)
MCV: 82.5 fL (ref 80.0–100.0)
PLATELETS: 794 10*3/uL — AB (ref 150–440)
RBC: 2.81 MIL/uL — ABNORMAL LOW (ref 4.40–5.90)
RDW: 18.1 % — AB (ref 11.5–14.5)
WBC: 16.6 10*3/uL — ABNORMAL HIGH (ref 3.8–10.6)

## 2018-08-04 LAB — CULTURE, RESPIRATORY W GRAM STAIN

## 2018-08-04 LAB — GLUCOSE, CAPILLARY
GLUCOSE-CAPILLARY: 110 mg/dL — AB (ref 70–99)
Glucose-Capillary: 117 mg/dL — ABNORMAL HIGH (ref 70–99)
Glucose-Capillary: 98 mg/dL (ref 70–99)
Glucose-Capillary: 93 mg/dL (ref 70–99)
Glucose-Capillary: 101 mg/dL — ABNORMAL HIGH (ref 70–99)

## 2018-08-04 LAB — CULTURE, RESPIRATORY

## 2018-08-04 MED ORDER — PRO-STAT SUGAR FREE PO LIQD
30.0000 mL | Freq: Every day | ORAL | Status: DC
  Administered 2018-08-05 – 2018-08-07 (×3): 30 mL

## 2018-08-04 MED ORDER — JUVEN PO PACK: 1.0000 | PACK | Freq: Two times a day (BID) | ORAL | Status: DC

## 2018-08-04 MED ORDER — IOPAMIDOL (ISOVUE-300) INJECTION 61%
50.0000 mL | Freq: Once | INTRAVENOUS | Status: AC | PRN
  Administered 2018-08-04: 14:00:00 50 mL

## 2018-08-04 MED ORDER — PANTOPRAZOLE SODIUM 40 MG IV SOLR
40.0000 mg | Freq: Two times a day (BID) | INTRAVENOUS | Status: DC
  Administered 2018-08-04 – 2018-08-07 (×7): 40 mg via INTRAVENOUS
  Filled 2018-08-04 (×8): qty 40

## 2018-08-04 MED ORDER — DEXTROSE 50 % IV SOLN
1.0000 | Freq: Once | INTRAVENOUS | Status: DC
Start: 2018-08-04 — End: 2018-08-04

## 2018-08-04 MED ORDER — FUROSEMIDE 10 MG/ML IJ SOLN
10.0000 mg | Freq: Every day | INTRAMUSCULAR | Status: DC
  Administered 2018-08-04 – 2018-08-07 (×4): 10 mg via INTRAVENOUS
  Filled 2018-08-04 (×4): qty 2

## 2018-08-04 MED ORDER — VITAMIN C 500 MG PO TABS
500.0000 mg | ORAL_TABLET | Freq: Two times a day (BID) | ORAL | Status: DC
  Administered 2018-08-04 – 2018-08-07 (×6): 500 mg
  Filled 2018-08-04 (×7): qty 1

## 2018-08-04 MED ORDER — JEVITY 1.5 CAL/FIBER PO LIQD
237.0000 mL | Freq: Every day | ORAL | Status: DC
  Administered 2018-08-04 – 2018-08-07 (×15): 237 mL

## 2018-08-04 MED ORDER — ALPRAZOLAM 0.5 MG PO TABS
0.5000 mg | ORAL_TABLET | Freq: Every evening | ORAL | Status: DC | PRN
  Administered 2018-08-04 – 2018-08-05 (×2): 0.5 mg via ORAL
  Filled 2018-08-04 (×3): qty 1

## 2018-08-04 MED ORDER — FREE WATER
180.0000 mL | Freq: Every day | Status: DC
  Administered 2018-08-04 – 2018-08-07 (×15): 180 mL

## 2018-08-04 MED ORDER — ORAL CARE MOUTH RINSE
15.0000 mL | Freq: Two times a day (BID) | OROMUCOSAL | Status: DC
  Administered 2018-08-05 – 2018-08-06 (×3): 15 mL via OROMUCOSAL

## 2018-08-04 MED ORDER — LORAZEPAM 2 MG/ML IJ SOLN
0.5000 mg | Freq: Two times a day (BID) | INTRAMUSCULAR | Status: DC | PRN
  Administered 2018-08-04: 0.5 mg via INTRAVENOUS
  Filled 2018-08-04: qty 1

## 2018-08-04 MED ORDER — CHLORHEXIDINE GLUCONATE 0.12 % MT SOLN
15.0000 mL | Freq: Two times a day (BID) | OROMUCOSAL | Status: DC
  Administered 2018-08-04 – 2018-08-07 (×6): 15 mL via OROMUCOSAL
  Filled 2018-08-04 (×5): qty 15

## 2018-08-04 MED ORDER — DEXTROSE 10 % IV SOLN
INTRAVENOUS | Status: DC
  Administered 2018-08-04: 03:00:00 via INTRAVENOUS

## 2018-08-04 NOTE — Progress Notes (Signed)
Pt has fluid leaking around PEG tube when fluid is inserted into tube. Fluid leaking smells/looks consistent with fluid inserted. Swoosh heard with small amount of air inserted. Site around tube is red. MD paged.

## 2018-08-04 NOTE — Progress Notes (Signed)
[employer] - [place] at [facility]   PATIENT NAME: Alexander Escobar    MR#:  [id number]  DATE OF BIRTH:  07/11/1961  SUBJECTIVE:  CHIEF COMPLAINT:   Chief Complaint  Patient presents with  . Respiratory Distress   Off oxygen today.  Afebrile.  Feels better.  Tolerating tube feeds.  His gastric tube was loose and some leaking around the tube last night so feeding was held, tightened up the tube today and sent for x-ray which confirms no extravasation and tube being in place and stomach.  REVIEW OF SYSTEMS:    Review of Systems  Constitutional: Positive for chills and malaise/fatigue. Negative for fever.  HENT: Negative for sore throat.   Eyes: Negative for blurred vision, double vision and pain.  Respiratory: Negative for cough, hemoptysis, shortness of breath and wheezing.   Cardiovascular: Negative for chest pain, palpitations, orthopnea and leg swelling.  Gastrointestinal: Negative for abdominal pain, constipation, diarrhea, heartburn, nausea and vomiting.  Genitourinary: Negative for dysuria and hematuria.  Musculoskeletal: Positive for back pain. Negative for joint pain.  Skin: Negative for rash.  Neurological: Negative for sensory change, speech change, focal weakness and headaches.  Endo/Heme/Allergies: Does not bruise/bleed easily.  Psychiatric/Behavioral: Negative for depression. The patient is not nervous/anxious.     DRUG ALLERGIES:   Allergies  Allergen Reactions  . Ciprofloxacin Swelling  . Contrast Media [Iodinated Diagnostic Agents] Other (See Comments)    Constipation   . Penicillins Rash    Has patient had a PCN reaction causing immediate rash, facial/tongue/throat swelling, SOB or lightheadedness with hypotension: Unknown Has patient had a PCN reaction causing severe rash involving mucus membranes or skin necrosis: Unknown Has patient had a PCN reaction that required hospitalization: Unknown Has patient had a PCN reaction occurring within the  last 10 years: Unknown If all of the above answers are "NO", then may proceed with Cephalosporin use.     VITALS:  Blood pressure 124/70, pulse 78, temperature 98.2 F (36.8 C), temperature source Oral, resp. rate 20, height 5\' 6"  (1.676 m), weight 72.4 kg, SpO2 98 %.  PHYSICAL EXAMINATION:   Physical Exam  GENERAL:  57 y.o.-year-old patient lying in the bed with no acute distress.  EYES: Pupils equal, round, reactive to light and accommodation. No scleral icterus. Extraocular muscles intact.  HEENT: Head atraumatic, normocephalic. Oropharynx and nasopharynx clear.  NECK:  Supple, no jugular venous distention. No thyroid enlargement, no tenderness.  LUNGS: Bilateral decreased breath sounds CARDIOVASCULAR: S1, S2 normal. No murmurs, rubs, or gallops.  ABDOMEN: Soft, nontender, nondistended. Bowel sounds present. No organomegaly or mass.  PEG tube in place EXTREMITIES: No cyanosis, clubbing or edema b/l.   Diffuse muscle wasting. NEUROLOGIC: Quadriplegia.  Atrophic limbs PSYCHIATRIC: The patient is alert and oriented x 3.  SKIN: Sacral decubitus ulcer  LABORATORY PANEL:   CBC Recent Labs  Lab 08/04/18 0510  WBC 16.6*  HGB 7.8*  HCT 23.2*  PLT 794*   ------------------------------------------------------------------------------------------------------------------ Chemistries  Recent Labs  Lab 08/02/18 0410 08/04/18 0510  NA 142 147*  K 3.5 3.3*  CL 110 115*  CO2 27 26  GLUCOSE 94 111*  BUN 27* 25*  CREATININE 0.96 0.92  CALCIUM 7.9* 8.0*  AST 27  --   ALT 20  --   ALKPHOS 88  --   BILITOT 0.7  --    ------------------------------------------------------------------------------------------------------------------  Cardiac Enzymes Recent Labs  Lab 08/03/18 1721  TROPONINI <0.03   ------------------------------------------------------------------------------------------------------------------  RADIOLOGY:  Dg Abd 1 View  Result Date: 08/04/2018 CLINICAL  DATA:  Gastrostomy tube retightening.  Assess placement. EXAM: ABDOMEN - 1 VIEW COMPARISON:  07/21/2018 FINDINGS: Gastrostomy tube over the stomach in the left upper quadrant. Contrast is seen filling the tube and stomach without extravasation. Remainder the exam is unchanged. IMPRESSION: Gastrostomy tube in adequate position over the stomach in the left upper quadrant. No extravasation. Electronically Signed   By: Elberta Fortisaniel  Boyle M.D.   On: 08/04/2018 14:20   Dg Chest Port 1 View  Result Date: 08/03/2018 CLINICAL DATA:  Chest pain.  Transient apnea EXAM: PORTABLE CHEST 1 VIEW COMPARISON:  August 01, 2018 FINDINGS: There remains extensive loculated pleural effusion on the left. There is airspace consolidation throughout the left lower lobe. There is a small right pleural effusion with patchy infiltrate in the right lower lobe. Heart is mildly enlarged with pulmonary vascularity normal. Pacemaker leads are attached to the right atrium and right ventricle. No adenopathy. Postoperative changes again noted in the lower cervical region. IMPRESSION: Persistent left lower lobe consolidation. Fairly extensive loculated pleural effusion on the left. Smaller pleural effusion on the right with patchy infiltrate right base. Stable cardiac prominence. Electronically Signed   By: Bretta BangWilliam  Woodruff III M.D.   On: 08/03/2018 17:10     ASSESSMENT AND PLAN:   * Bilateral pneumonia with acute hypoxic respiratory failure sepsis present on admission Most likely aspiration pneumonia due to patient's dysphagia. Patient on cefepime, Flagyl.  Vancomycin added due to gram-positive cocci in sputum and MRSA PCR positive. Sputum cx reports MRSA, xray shows worsening and some possibility of loculated effusion. Will call Thorasic surgery and ID.  * Worsening anemia over chronic anemia.  GI consulted due to history of melena intermittently.  Recommended no endoscopy procedures at this time and anemia start to be likely from chronic  disease.  * Dysphagia with PEG tube in place.  Resumed tube feeds.   As tube was leaking, tightened, and checked by Xray - proper position now.  * Anxiety- ativan changed to xanax per pt and family request.  All the records are reviewed and case discussed with Care Management/Social Worker Management plans discussed with the patient, family and they are in agreement.  CODE STATUS: DNR  DVT Prophylaxis: SCDs  TOTAL TIME TAKING CARE OF THIS PATIENT: 35 minutes.   Likely discharge tomorrow back to skilled nursing facility  Altamese DillingVaibhavkumar Ario Mcdiarmid M.D on 08/04/2018 at 5:19 PM  Between 7am to 6pm - Pager - [phone]  After 6pm go to [email or web address] - password [email or web address] [facility]  SOUND  Hospitalists  Office  [phone]  CC: Primary care physician; Charlott RakesHodges, Francisco, MD  Note: This dictation was prepared with Dragon dictation along with smaller phrase technology. Any transcriptional errors that result from this process are unintentional.

## 2018-08-04 NOTE — Progress Notes (Signed)
Cough assist maneuver performed & tol. Well.  Pt expectorated mod amt of thick white sputum which was evacuated by oral suction.

## 2018-08-04 NOTE — Progress Notes (Signed)
MD stated to not use G tube until GI evaluates placement.

## 2018-08-04 NOTE — Progress Notes (Signed)
MEDICATION RELATED CONSULT NOTE - INITIAL   Pharmacy Consult for per tube to IV conversion Indication: failed G tube  Allergies  Allergen Reactions  . Ciprofloxacin Swelling  . Contrast Media [Iodinated Diagnostic Agents] Other (See Comments)    Constipation   . Penicillins Rash    Has patient had a PCN reaction causing immediate rash, facial/tongue/throat swelling, SOB or lightheadedness with hypotension: Unknown Has patient had a PCN reaction causing severe rash involving mucus membranes or skin necrosis: Unknown Has patient had a PCN reaction that required hospitalization: Unknown Has patient had a PCN reaction occurring within the last 10 years: Unknown If all of the above answers are "NO", then may proceed with Cephalosporin use.     Patient Measurements: Height: 5\' 6"  (167.6 cm) Weight: 152 lb 1.9 oz (69 kg) IBW/kg (Calculated) : 63.8   Vital Signs: Temp: 98.1 F (36.7 C) (08/17 2043) Temp Source: Oral (08/17 2043) BP: 124/66 (08/17 2043) Pulse Rate: 81 (08/17 2043) Intake/Output from previous day: No intake/output data recorded. Intake/Output from this shift: No intake/output data recorded.  Labs: Recent Labs    08/01/18 0618 08/02/18 0410  WBC 20.0* 21.6*  HGB 6.8* 7.9*  HCT 21.2* 23.8*  PLT 846* 787*  CREATININE 1.14 0.96  ALBUMIN 1.9* 1.8*  PROT 6.9 6.4*  AST 30 27  ALT 24 20  ALKPHOS 91 88  BILITOT 0.4 0.7   Estimated Creatinine Clearance: 78.5 mL/min (by C-G formula based on SCr of 0.96 mg/dL).   Microbiology: Recent Results (from the past 720 hour(s))  Culture, blood (Routine x 2)     Status: None (Preliminary result)   Collection Time: 08/01/18  6:18 AM  Result Value Ref Range Status   Specimen Description BLOOD LEFT FATTY CASTS  Final   Special Requests   Final    BOTTLES DRAWN AEROBIC AND ANAEROBIC Blood Culture adequate volume   Culture   Final    NO GROWTH 2 DAYS Performed at [facility], [address] [place],  [place] [address]    Report Status PENDING  Incomplete  Culture, blood (Routine x 2)     Status: None (Preliminary result)   Collection Time: 08/01/18  6:18 AM  Result Value Ref Range Status   Specimen Description BLOOD RIGHT HAND  Final   Special Requests   Final    BOTTLES DRAWN AEROBIC AND ANAEROBIC Blood Culture adequate volume   Culture   Final    NO GROWTH 2 DAYS Performed at [facility], [address], [place], [place] [address]    Report Status PENDING  Incomplete  Urine Culture     Status: None   Collection Time: 08/01/18  6:18 AM  Result Value Ref Range Status   Specimen Description   Final    URINE, RANDOM Performed at [facility], [address], [place], [place] [address]    Special Requests   Final    Normal Performed at [facility], [address], [place], [place] [address]    Culture   Final    NO GROWTH Performed at [facility], [address] [place], [place] [address]    Report Status 08/03/2018 FINAL  Final  MRSA PCR Screening     Status: Abnormal   Collection Time: 08/01/18  7:49 PM  Result Value Ref Range Status   MRSA by PCR POSITIVE (A) NEGATIVE Final    Comment:        The GeneXpert MRSA Assay (FDA approved  for NASAL specimens only), is one component of a comprehensive MRSA colonization surveillance program. It is not intended to diagnose MRSA infection nor to guide or monitor treatment for MRSA infections. RESULT CALLED TO, READ BACK BY AND VERIFIED WITH: [facility] RN AT 2115 08/01/18.MSS Performed at [facility], [address] [place], [place] [address]   Culture, sputum-assessment     Status: None   Collection Time: 08/01/18  8:04 PM  Result Value Ref Range Status   Specimen Description SPUTUM  Final   Special Requests NONE  Final   Sputum evaluation   Final    THIS SPECIMEN IS ACCEPTABLE FOR SPUTUM CULTURE Performed at [facility], [address] [place], [place]  [address]    Report Status 08/01/2018 FINAL  Final  Culture, respiratory     Status: None (Preliminary result)   Collection Time: 08/01/18  8:04 PM  Result Value Ref Range Status   Specimen Description   Final    SPUTUM Performed at [facility], [address] [place], [place] [address]    Special Requests   Final    NONE Reflexed from [id number] Performed at [facility], [address] [place], [place] [address]    Gram Stain   Final    ABUNDANT WBC PRESENT, PREDOMINANTLY PMN FEW GRAM POSITIVE COCCI    Culture   Final    FEW STAPHYLOCOCCUS AUREUS SUSCEPTIBILITIES TO FOLLOW Performed at [facility], [address] [place], [place] [address]    Report Status PENDING  Incomplete    Medical History: Past Medical History:  Diagnosis Date  . Congenital absence of external auditory canal   . Gastroesophageal reflux disease   . Presence of permanent cardiac pacemaker   . Quadriplegia (HCC)   . Recurrent major depression (HCC)   . Severe protein-calorie malnutrition (HCC)     Medications:  Scheduled:  . baclofen  10 mg Per Tube Daily  . baclofen  20 mg Per Tube QHS  . Chlorhexidine Gluconate Cloth  6 each Topical Q0600  . docusate  200 mg Per Tube QHS  . doxepin  10 mg Per Tube QHS  . feeding supplement (JEVITY 1.5 CAL/FIBER)  240 mL Per Tube QID  . feeding supplement (PRO-STAT SUGAR FREE 64)  30 mL Per Tube BID  . ferrous sulfate  325 mg Per Tube TID WC  . FLUoxetine  20 mg Per Tube Daily  . free water  250 mL Per Tube TID PC & HS  . furosemide  10 mg Intravenous Daily  . ipratropium-albuterol  3 mL Nebulization Q6H  . loratadine  10 mg Per Tube Daily  . Melatonin  2.5 mg Per Tube QHS  . mupirocin ointment  1 application Nasal BID  . pantoprazole (PROTONIX) IV  40 mg Intravenous Q12H  . polyethylene glycol  17 g Per Tube Daily  . sennosides  10 mL Per Tube QHS  . sodium chloride flush  3 mL Intravenous Q12H    Assessment: Patient admitted  for pneumonia w/ quadriplegia and G-tube. Tonight patient's G-tube failed and RN is unable to administer medications per tube. Pharmacy to assist with per tube to IV medications until G-tube can be replaced  Plan:  Patient was initially on the following PO/per tube: Furosemide 20 mg daily  Alprazolam 0.5 mg bid prn anxiety Pantoprazole 40 mg bid suspension  Will switch to: Furosemide 10 mg IV daily Lorazepam 0.5 mg IV bid prn anxiety Pantoprazole 40 mg  IV bid  Will also add d10% @ 10 ml/hr until G-tube can be fixed since tube feeds have been stopped.  Thomasene Rippleavid Khloee Garza, PharmD, BCPS Clinical Pharmacist 08/04/2018

## 2018-08-04 NOTE — Progress Notes (Signed)
CPT refused. Quad coughing assist performed after SVN. Non-prod cough.

## 2018-08-04 NOTE — Consult Note (Signed)
Consult cancelled.
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
## 2018-08-05 ENCOUNTER — Inpatient Hospital Stay: Payer: Medicaid Other

## 2018-08-05 LAB — GLUCOSE, CAPILLARY
GLUCOSE-CAPILLARY: 90 mg/dL (ref 70–99)
GLUCOSE-CAPILLARY: 121 mg/dL — AB (ref 70–99)
GLUCOSE-CAPILLARY: 90 mg/dL (ref 70–99)
GLUCOSE-CAPILLARY: 119 mg/dL — AB (ref 70–99)
Glucose-Capillary: 121 mg/dL — ABNORMAL HIGH (ref 70–99)

## 2018-08-05 LAB — VANCOMYCIN, TROUGH: VANCOMYCIN TR: 20 ug/mL (ref 15–20)

## 2018-08-05 MED ORDER — IPRATROPIUM-ALBUTEROL 0.5-2.5 (3) MG/3ML IN SOLN
3.0000 mL | Freq: Three times a day (TID) | RESPIRATORY_TRACT | Status: DC
Start: 2018-08-05 — End: 2018-08-07
  Administered 2018-08-05 – 2018-08-07 (×6): 3 mL via RESPIRATORY_TRACT
  Filled 2018-08-05 (×6): qty 3

## 2018-08-05 MED ORDER — FREE WATER
250.0000 mL | Status: DC
  Administered 2018-08-05 – 2018-08-07 (×3): 250 mL

## 2018-08-05 MED ORDER — POTASSIUM CHLORIDE 20 MEQ/15ML (10%) PO SOLN
20.0000 meq | Freq: Two times a day (BID) | ORAL | Status: DC
  Administered 2018-08-05 – 2018-08-07 (×5): 20 meq
  Filled 2018-08-05 (×4): qty 15
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
## 2018-08-05 NOTE — Progress Notes (Signed)
Cough assist performed after SVN. Pt had productive cough & swallowed sputum.
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
## 2018-08-05 NOTE — Consult Note (Signed)
Pharmacy Antibiotic Note  Alexander CofferJames Escobar is a 57 y.o. male admitted on 08/01/2018 with aspiration pneumonia.  Pharmacy has been consulted for Cefepime + Flagyl + Vancomycin dosing. Patient is quadriplegic.  Currently on Vancomycin 1g IV q24h, Vancomycin trough prior to 5th dose resulted at 20 mcg/ml. Renal function is stable.   Plan: Vancomycin trough is within goal of 15-20 mcg/ml .Will continue Vancomycin 1g IV q24h.Watch for any changes to renal function. Continue Cefepime 1 gram every 8 hours Continue Flagyl 500 mg every 8 hours  Estimated PK ke 0.038 T1/2 18.2 hours Vd 48.4 ~CrCl 40 mL/min   Height: 5\' 6"  (167.6 cm) Weight: 160 lb 9.6 oz (72.8 kg) IBW/kg (Calculated) : 63.8  Temp (24hrs), Avg:98.4 F (36.9 C), Min:98.3 F (36.8 C), Max:98.5 F (36.9 C)  Recent Labs  Lab 08/01/18 0618 08/02/18 0410 08/04/18 0510 08/05/18 1446  WBC 20.0* 21.6* 16.6*  --   CREATININE 1.14 0.96 0.92  --   LATICACIDVEN 1.8  --   --   --   VANCOTROUGH  --   --   --  20    Estimated Creatinine Clearance: 81.9 mL/min (by C-G formula based on SCr of 0.92 mg/dL).    Allergies  Allergen Reactions  . Ciprofloxacin Swelling  . Contrast Media [Iodinated Diagnostic Agents] Other (See Comments)    Constipation   . Penicillins Rash    Has patient had a PCN reaction causing immediate rash, facial/tongue/throat swelling, SOB or lightheadedness with hypotension: Unknown Has patient had a PCN reaction causing severe rash involving mucus membranes or skin necrosis: Unknown Has patient had a PCN reaction that required hospitalization: Unknown Has patient had a PCN reaction occurring within the last 10 years: Unknown If all of the above answers are "NO", then may proceed with Cephalosporin use.     Antimicrobials this admission: Cefepime 0815 >>  Vancomycin 0816 >> Flagyl 0815 >>     Microbiology results: 0815 BCx: NGTD 0815 Sputum: WBC present, few MRSA MRSA PCR +  Thank you for allowing  pharmacy to be a part of this patient's care.  Pharmacy will continue to monitor.  Clovia CuffLisa Suheyla Mortellaro, PharmD, BCPS 08/05/2018 4:36 PM

## 2018-08-05 NOTE — Progress Notes (Signed)
[facility] [employer] [facility] [place] [facility]   PATIENT NAME: Alexander Escobar    MR#:  [id number]  DATE OF BIRTH:  01/09/1961  SUBJECTIVE:  CHIEF COMPLAINT:   Chief Complaint  Patient presents with  . Respiratory Distress   Off oxygen today.  Afebrile.  Feels better.  Tolerating tube feeds.  Xray chest shows pleural effusion.  REVIEW OF SYSTEMS:    Review of Systems  Constitutional: Positive for chills and malaise/fatigue. Negative for fever.  HENT: Negative for sore throat.   Eyes: Negative for blurred vision, double vision and pain.  Respiratory: Negative for cough, hemoptysis, shortness of breath and wheezing.   Cardiovascular: Negative for chest pain, palpitations, orthopnea and leg swelling.  Gastrointestinal: Negative for abdominal pain, constipation, diarrhea, heartburn, nausea and vomiting.  Genitourinary: Negative for dysuria and hematuria.  Musculoskeletal: Positive for back pain. Negative for joint pain.  Skin: Negative for rash.  Neurological: Negative for sensory change, speech change, focal weakness and headaches.  Endo/Heme/Allergies: Does not bruise/bleed easily.  Psychiatric/Behavioral: Negative for depression. The patient is not nervous/anxious.     DRUG ALLERGIES:   Allergies  Allergen Reactions  . Ciprofloxacin Swelling  . Contrast Media [Iodinated Diagnostic Agents] Other (See Comments)    Constipation   . Penicillins Rash    Has patient had a PCN reaction causing immediate rash, facial/tongue/throat swelling, SOB or lightheadedness with hypotension: Unknown Has patient had a PCN reaction causing severe rash involving mucus membranes or skin necrosis: Unknown Has patient had a PCN reaction that required hospitalization: Unknown Has patient had a PCN reaction occurring within the last 10 years: Unknown If all of the above answers are "NO", then may proceed with Cephalosporin use.     VITALS:  Blood pressure (!) 105/55, pulse 93, temperature  98.3 F (36.8 C), temperature source Oral, resp. rate 16, height 5\' 6"  (1.676 m), weight 72.8 kg, SpO2 96 %.  PHYSICAL EXAMINATION:   Physical Exam  GENERAL:  57 y.o.-year-old patient lying in the bed with no acute distress.  EYES: Pupils equal, round, reactive to light and accommodation. No scleral icterus. Extraocular muscles intact.  HEENT: Head atraumatic, normocephalic. Oropharynx and nasopharynx clear.  NECK:  Supple, no jugular venous distention. No thyroid enlargement, no tenderness.  LUNGS: Bilateral decreased breath sounds CARDIOVASCULAR: S1, S2 normal. No murmurs, rubs, or gallops.  ABDOMEN: Soft, nontender, nondistended. Bowel sounds present. No organomegaly or mass.  PEG tube in place EXTREMITIES: No cyanosis, clubbing or edema b/l.   Diffuse muscle wasting. NEUROLOGIC: Quadriplegia.  Atrophic limbs PSYCHIATRIC: The patient is alert and oriented x 3.  SKIN: Sacral decubitus ulcer  LABORATORY PANEL:   CBC Recent Labs  Lab 08/04/18 0510  WBC 16.6*  HGB 7.8*  HCT 23.2*  PLT 794*   ------------------------------------------------------------------------------------------------------------------ Chemistries  Recent Labs  Lab 08/02/18 0410 08/04/18 0510  NA 142 147*  K 3.5 3.3*  CL 110 115*  CO2 27 26  GLUCOSE 94 111*  BUN 27* 25*  CREATININE 0.96 0.92  CALCIUM 7.9* 8.0*  AST 27  --   ALT 20  --   ALKPHOS 88  --   BILITOT 0.7  --    ------------------------------------------------------------------------------------------------------------------  Cardiac Enzymes Recent Labs  Lab 08/03/18 1721  TROPONINI <0.03   ------------------------------------------------------------------------------------------------------------------  RADIOLOGY:  Dg Abd 1 View  Result Date: 08/04/2018 CLINICAL DATA:  Gastrostomy tube retightening.  Assess placement. EXAM: ABDOMEN - 1 VIEW COMPARISON:  07/21/2018 FINDINGS: Gastrostomy tube over the stomach in the left upper  quadrant. Contrast is seen filling the tube and stomach without extravasation. Remainder the exam is unchanged. IMPRESSION: Gastrostomy tube in adequate position over the stomach in the left upper quadrant. No extravasation. Electronically Signed   By: Elberta Fortisaniel  Boyle M.D.   On: 08/04/2018 14:20   [place] Chest (pleural Effusion)  Result Date: 08/05/2018 CLINICAL DATA:  57 year old quadriplegic male with a history of frequent pneumonia is an recurrent left-sided pleural effusions. He presents today for attempted thoracentesis. EXAM: CHEST ULTRASOUND COMPARISON:  Chest x-ray 08/03/2018 FINDINGS: The left chest was interrogated with ultrasound. There is a multiloculated and highly complex pleural effusion. The individual fluid pockets are very small in size. Given that the patient is currently clinically asymptomatic, thoracentesis was deferred. IMPRESSION: Highly complex/loculated left-sided pleural effusion without dominant fluid pocket. Thoracentesis would be highly unlikely to provide adequate drainage and was therefore deferred. Electronically Signed   By: Malachy MoanHeath  McCullough M.D.   On: 08/05/2018 16:22   Dg Chest Port 1 View  Result Date: 08/03/2018 CLINICAL DATA:  Chest pain.  Transient apnea EXAM: PORTABLE CHEST 1 VIEW COMPARISON:  August 01, 2018 FINDINGS: There remains extensive loculated pleural effusion on the left. There is airspace consolidation throughout the left lower lobe. There is a small right pleural effusion with patchy infiltrate in the right lower lobe. Heart is mildly enlarged with pulmonary vascularity normal. Pacemaker leads are attached to the right atrium and right ventricle. No adenopathy. Postoperative changes again noted in the lower cervical region. IMPRESSION: Persistent left lower lobe consolidation. Fairly extensive loculated pleural effusion on the left. Smaller pleural effusion on the right with patchy infiltrate right base. Stable cardiac prominence. Electronically Signed   By:  Bretta BangWilliam  Woodruff III M.D.   On: 08/03/2018 17:10     ASSESSMENT AND PLAN:   * Bilateral pneumonia with acute hypoxic respiratory failure sepsis present on admission Most likely aspiration pneumonia due to patient's dysphagia. Patient on cefepime, Flagyl.  Vancomycin added due to gram-positive cocci in sputum and MRSA PCR positive. Sputum cx reports MRSA, xray shows worsening and some possibility of loculated effusion. Consulted Thorasic surgery and ID.  Stopped cefepime and flagyl as MRSA in sputum cx.  * parapneumonic effusion   Appreciated help by Thorasic surgery   US guided tap tried, but highly loculated fluid, no drainage done.  * Worsening anemia over chronic anemia.  GI consulted due to history of melena intermittently.  Recommended no endoscopy procedures at this time and anemia start to be likely from chronic disease.  * Dysphagia with PEG tube in place.  Resumed tube feeds.   As tube was leaking, tightened, and checked by Xray - proper position now.  resume feeding.  * Anxiety- ativan changed to xanax per pt and family request.  All the records are reviewed and case discussed with Care Management/Social Worker Management plans discussed with the patient, family and they are in agreement.  CODE STATUS: DNR  DVT Prophylaxis: SCDs  TOTAL TIME TAKING CARE OF THIS PATIENT: 35 minutes.    Altamese DillingVaibhavkumar Albana Saperstein M.D on 08/05/2018 at 4:36 PM  Between 7am to 6pm - Pager - [phone]  After 6pm go to [email or web address] - password [email or web address] [facility]  [facility] [place] [facility]  Office  [phone]  CC: Primary care physician; Charlott RakesHodges, Francisco, MD  Note: This dictation was prepared with Dragon dictation along with smaller phrase technology. Any transcriptional errors that result from this process are unintentional.

## 2018-08-06 ENCOUNTER — Inpatient Hospital Stay: Payer: Medicaid Other

## 2018-08-06 ENCOUNTER — Inpatient Hospital Stay: Payer: Self-pay

## 2018-08-06 DIAGNOSIS — J869 Pyothorax without fistula: Secondary | ICD-10-CM

## 2018-08-06 DIAGNOSIS — J15212 Pneumonia due to Methicillin resistant Staphylococcus aureus: Secondary | ICD-10-CM

## 2018-08-06 DIAGNOSIS — J90 Pleural effusion, not elsewhere classified: Secondary | ICD-10-CM

## 2018-08-06 LAB — CULTURE, BLOOD (ROUTINE X 2)
CULTURE: NO GROWTH
Culture: NO GROWTH
SPECIAL REQUESTS: ADEQUATE
Special Requests: ADEQUATE

## 2018-08-06 LAB — COMPREHENSIVE METABOLIC PANEL
ALK PHOS: 63 U/L (ref 38–126)
ALT: 12 U/L (ref 0–44)
AST: 18 U/L (ref 15–41)
Albumin: 1.7 g/dL — ABNORMAL LOW (ref 3.5–5.0)
Anion gap: 4 — ABNORMAL LOW (ref 5–15)
BILIRUBIN TOTAL: 0.6 mg/dL (ref 0.3–1.2)
BUN: 28 mg/dL — AB (ref 6–20)
CALCIUM: 8 mg/dL — AB (ref 8.9–10.3)
CO2: 31 mmol/L (ref 22–32)
CREATININE: 0.88 mg/dL (ref 0.61–1.24)
Chloride: 110 mmol/L (ref 98–111)
Glucose, Bld: 88 mg/dL (ref 70–99)
Potassium: 4.3 mmol/L (ref 3.5–5.1)
Sodium: 145 mmol/L (ref 135–145)
TOTAL PROTEIN: 6.2 g/dL — AB (ref 6.5–8.1)

## 2018-08-06 LAB — GLUCOSE, CAPILLARY
GLUCOSE-CAPILLARY: 100 mg/dL — AB (ref 70–99)
GLUCOSE-CAPILLARY: 95 mg/dL (ref 70–99)
Glucose-Capillary: 119 mg/dL — ABNORMAL HIGH (ref 70–99)

## 2018-08-06 LAB — CBC
HCT: 26.9 % — ABNORMAL LOW (ref 40.0–52.0)
HEMOGLOBIN: 8.6 g/dL — AB (ref 13.0–18.0)
MCH: 26.8 pg (ref 26.0–34.0)
MCHC: 32 g/dL (ref 32.0–36.0)
MCV: 83.9 fL (ref 80.0–100.0)
PLATELETS: 733 10*3/uL — AB (ref 150–440)
RBC: 3.21 MIL/uL — ABNORMAL LOW (ref 4.40–5.90)
RDW: 18.7 % — AB (ref 11.5–14.5)
WBC: 12.5 10*3/uL — AB (ref 3.8–10.6)

## 2018-08-06 MED ORDER — SODIUM CHLORIDE 0.9% FLUSH
10.0000 mL | Freq: Two times a day (BID) | INTRAVENOUS | Status: DC
  Administered 2018-08-06 – 2018-08-07 (×2): 10 mL

## 2018-08-06 MED ORDER — SODIUM CHLORIDE 0.9% FLUSH: 10.0000 mL | INTRAVENOUS | Status: DC | PRN

## 2018-08-06 NOTE — Progress Notes (Signed)
[facility] - [place] [facility]   PATIENT NAME: Alexander CofferJames Escobar    MR#:  [id number]  DATE OF BIRTH:  01/03/1961  SUBJECTIVE:  CHIEF COMPLAINT:   Chief Complaint  Patient presents with  . Respiratory Distress   Off oxygen today.  Afebrile.  Feels better.  Tolerating tube feeds.  Xray chest shows pleural effusion.  REVIEW OF SYSTEMS:    Review of Systems  Constitutional: Positive for chills and malaise/fatigue. Negative for fever.  HENT: Negative for sore throat.   Eyes: Negative for blurred vision, double vision and pain.  Respiratory: Negative for cough, hemoptysis, shortness of breath and wheezing.   Cardiovascular: Negative for chest pain, palpitations, orthopnea and leg swelling.  Gastrointestinal: Negative for abdominal pain, constipation, diarrhea, heartburn, nausea and vomiting.  Genitourinary: Negative for dysuria and hematuria.  Musculoskeletal: Positive for back pain. Negative for joint pain.  Skin: Negative for rash.  Neurological: Negative for sensory change, speech change, focal weakness and headaches.  Endo/Heme/Allergies: Does not bruise/bleed easily.  Psychiatric/Behavioral: Negative for depression. The patient is not nervous/anxious.     DRUG ALLERGIES:   Allergies  Allergen Reactions  . Ciprofloxacin Swelling  . Contrast Media [Iodinated Diagnostic Agents] Other (See Comments)    Constipation   . Penicillins Rash    Has patient had a PCN reaction causing immediate rash, facial/tongue/throat swelling, SOB or lightheadedness with hypotension: Unknown Has patient had a PCN reaction causing severe rash involving mucus membranes or skin necrosis: Unknown Has patient had a PCN reaction that required hospitalization: Unknown Has patient had a PCN reaction occurring within the last 10 years: Unknown If all of the above answers are "NO", then may proceed with Cephalosporin use.     VITALS:  Blood pressure 124/77, pulse 90, temperature 99.3  F (37.4 C), temperature source Oral, resp. rate 20, height 5\' 6"  (1.676 m), weight 74.4 kg, SpO2 100 %.  PHYSICAL EXAMINATION:   Physical Exam  GENERAL:  57 y.o.-year-old patient lying in the bed with no acute distress.  EYES: Pupils equal, round, reactive to light and accommodation. No scleral icterus. Extraocular muscles intact.  HEENT: Head atraumatic, normocephalic. Oropharynx and nasopharynx clear.  NECK:  Supple, no jugular venous distention. No thyroid enlargement, no tenderness.  LUNGS: Bilateral decreased breath sounds CARDIOVASCULAR: S1, S2 normal. No murmurs, rubs, or gallops.  ABDOMEN: Soft, nontender, nondistended. Bowel sounds present. No organomegaly or mass.  PEG tube in place EXTREMITIES: No cyanosis, clubbing or edema b/l.   Diffuse muscle wasting. NEUROLOGIC: Quadriplegia.  Atrophic limbs PSYCHIATRIC: The patient is alert and oriented x 3.  SKIN: Sacral decubitus ulcer  LABORATORY PANEL:   CBC Recent Labs  Lab 08/06/18 0459  WBC 12.5*  HGB 8.6*  HCT 26.9*  PLT 733*   ------------------------------------------------------------------------------------------------------------------ Chemistries  Recent Labs  Lab 08/06/18 0459  NA 145  K 4.3  CL 110  CO2 31  GLUCOSE 88  BUN 28*  CREATININE 0.88  CALCIUM 8.0*  AST 18  ALT 12  ALKPHOS 63  BILITOT 0.6   ------------------------------------------------------------------------------------------------------------------  Cardiac Enzymes Recent Labs  Lab 08/03/18 1721  TROPONINI <0.03   ------------------------------------------------------------------------------------------------------------------  RADIOLOGY:  Ct Chest Wo Contrast  Result Date: 08/06/2018 CLINICAL DATA:  Bilateral pneumonia with acute hypoxic respiratory failure and sepsis. EXAM: CT CHEST WITHOUT CONTRAST TECHNIQUE: Multidetector CT imaging of the chest was performed following the standard protocol without IV contrast.  COMPARISON:  Chest x-rays dated 08/03/2018 and 08/01/2018 FINDINGS: Cardiovascular: Heart size is normal. Ascending thoracic aorta  is dilated to a diameter of approximately 3.9 cm. Coronary artery calcifications. No pericardial effusion. Pacemaker in place. Mediastinum/Nodes: Several small precarinal lymph nodes, most likely reactive. Benign-appearing calcifications in the right lobe of the thyroid gland. Esophagus and trachea appear normal. Lungs/Pleura: There is consolidation and compressive atelectasis of the entire left lower lobe. There is focal consolidation and compressive atelectasis in the posterior aspect of the right lower lobe. There are small patchy areas of infiltrate in the right upper lobe. There is a large loculated left pleural effusion and a moderate right pleural effusion. Ultrasound appearance on 08/05/2018 demonstrated extensive internal septations in the fluid collection on the left consistent with an empyema. Upper Abdomen: There is marked chronic hydronephrosis of the left kidney with severe thinning of the renal cortex. Multiple stones in the right kidney. IVC filter in place. Gastrostomy tube in place, incompletely visualized. Musculoskeletal: No acute bone abnormality. Cervicothoracic surgical fusion. Chronic accentuation of the thoracic kyphosis. The posterior elements of the entire thoracic spine are diffusely fused. IMPRESSION: 1. Bilateral lower lobe pneumonia, more extensive on the left than the right. Small patchy areas of pneumonia in the right upper lobe. 2. Large left empyema. 3. Moderate right pleural effusion which may also represent an empyema. 4. Chronic severe hydronephrosis of the left kidney. Electronically Signed   By: Francene Boyers M.D.   On: 08/06/2018 10:39   [place] Chest (pleural Effusion)  Result Date: 08/05/2018 CLINICAL DATA:  57 year old quadriplegic male with a history of frequent pneumonia is an recurrent left-sided pleural effusions. He presents today for  attempted thoracentesis. EXAM: CHEST ULTRASOUND COMPARISON:  Chest x-ray 08/03/2018 FINDINGS: The left chest was interrogated with ultrasound. There is a multiloculated and highly complex pleural effusion. The individual fluid pockets are very small in size. Given that the patient is currently clinically asymptomatic, thoracentesis was deferred. IMPRESSION: Highly complex/loculated left-sided pleural effusion without dominant fluid pocket. Thoracentesis would be highly unlikely to provide adequate drainage and was therefore deferred. Electronically Signed   By: Malachy Moan M.D.   On: 08/05/2018 16:22   [place] Ekg Site Rite  Result Date: 08/06/2018 If Site Rite image not attached, placement could not be confirmed due to current cardiac rhythm.    ASSESSMENT AND PLAN:   * Bilateral pneumonia with acute hypoxic respiratory failure sepsis present on admission Most likely aspiration pneumonia due to patient's dysphagia. Patient on cefepime, Flagyl.  Vancomycin added due to gram-positive cocci in sputum and MRSA PCR positive. Sputum cx reports MRSA, xray shows worsening and some possibility of loculated effusion. Consulted Thorasic surgery and ID.  Stopped cefepime and flagyl as MRSA in sputum cx. As the effusion is loculated interventional radiologist could not help much.  Dr. Thelma Barge had seen the patient and suggested thoracoscopy and decortication would be very risky because of patient's baseline conditions like quadriplegia and he does not have much fluid. He suggested to treat with IV antibiotic and follow-up.  Patient and his POA had agreed on that option, I will order PICC line placement  * parapneumonic effusion   Appreciated help by Thorasic surgery   US guided tap tried, but highly loculated fluid, no drainage done.  * Worsening anemia over chronic anemia.  GI consulted due to history of melena intermittently.  Recommended no endoscopy procedures at this time and anemia start to be likely  from chronic disease.  * Dysphagia with PEG tube in place.  Resumed tube feeds.   As tube was leaking, tightened, and checked by Xray -  proper position now.  resume feeding.  * Anxiety- ativan changed to xanax per pt and family request.  All the records are reviewed and case discussed with Care Management/Social Worker Management plans discussed with the patient, family and they are in agreement.  CODE STATUS: DNR  DVT Prophylaxis: SCDs  TOTAL TIME TAKING CARE OF THIS PATIENT: 35 minutes.    Altamese DillingVaibhavkumar Jaimee Corum M.D on 08/06/2018 at 3:44 PM  Between 7am to 6pm - Pager - [phone]  After 6pm go to [email or web address] - password [email or web address]  [facility] [place] [facility]  Office  [phone]  CC: Primary care physician; Charlott RakesHodges, Francisco, MD  Note: This dictation was prepared with Dragon dictation along with smaller phrase technology. Any transcriptional errors that result from this process are unintentional.

## 2018-08-06 NOTE — Progress Notes (Signed)
Diagnosis: MRSA pneumonia and Empyema  Baseline Creatinine on 08/06/18 0.88 Vanco trough on 8/19 is 20  Culture Result: MRSA in sputum   OPAT Orders Discharge antibiotics: Vancomycin  1000mg  IVPB every 24 hrs Per pharmacy protocol : consult your pharmacist to adjust dose according to trough level Aim for Vancomycin trough 15-20  Duration: 25 more days  End Date:08/31/18   [facility]:  Labs weekly while on IV antibiotics: _X_ CBC with differential on Monday _X_ BMP every Monday and Thursday __X Vancomycin trough every Monday and Thursday  CXR in 2 weeks   Please pull PIC at completion of IV antibiotics  Call Dr.Rolla Kedzierski with any questions or critical value at [phone]

## 2018-08-06 NOTE — Progress Notes (Signed)
Peripherally Inserted Central Catheter/Midline Placement  The IV Nurse has discussed with the patient and/or persons authorized to consent for the patient, the purpose of this procedure and the potential benefits and risks involved with this procedure.  The benefits include less needle sticks, lab draws from the catheter, and the patient may be discharged home with the catheter. Risks include, but not limited to, infection, bleeding, blood clot (thrombus formation), and puncture of an artery; nerve damage and irregular heartbeat and possibility to perform a PICC exchange if needed/ordered by physician.  Alternatives to this procedure were also discussed.  Bard Power PICC patient education guide, fact sheet on infection prevention and patient information card has been provided to patient /or left at bedside.    PICC/Midline Placement Documentation  PICC Single Lumen 08/06/18 PICC Right Cephalic 38 cm 0 cm (Active)  Indication for Insertion or Continuance of Line Home intravenous therapies (PICC only) 08/06/2018  5:34 PM  Exposed Catheter (cm) 0 cm 08/06/2018  5:34 PM  Site Assessment Clean;Dry;Intact 08/06/2018  5:34 PM  Line Status Flushed;Saline locked;Blood return noted 08/06/2018  5:34 PM  Dressing Type Transparent 08/06/2018  5:34 PM  Dressing Status Clean;Dry;Intact;Antimicrobial disc in place 08/06/2018  5:34 PM  Dressing Change Due 08/13/18 08/06/2018  5:34 PM       Ethelda Chickurrie, Bobbi Yount Robert 08/06/2018, 5:35 PM

## 2018-08-06 NOTE — Progress Notes (Signed)
  Patient ID: Alexander Escobar, male   DOB: 10/16/1961, [age] y.o.   MRN: [id number]  HISTORY: He states that he had a pretty quiet night.  He denies any fevers or chills.  He denies any chest pain or shortness of breath.  He did have an ultrasound performed of the left chest yesterday which revealed extensive loculations.  An attempt at percutaneous drainage by our interventional radiologist was not possible.   Vitals:   08/06/18 0226 08/06/18 0423  BP:  111/66  Pulse:  86  Resp:  16  Temp:  97.7 F (36.5 C)  SpO2: 97% 100%     EXAM:    Resp: Lungs are diminished bilaterally particularly on the left.  No respiratory distress, normal effort. Heart:  Regular without murmurs Abd:  Abdomen is soft, non distended and non tender. No masses are palpable.  There is no rebound and no guarding.  Skin: Skin is warm and dry. No rash noted. No diaphoretic. No erythema. No pallor.  Psychiatric: Normal mood and affect. Normal behavior. Judgment and thought content normal.    ASSESSMENT: I have independently reviewed the patient's chest CT.  I have also reviewed the CT scan with our interventional radiologists Dr. Karle StarchLukens.  The opacity at the left hemithorax is a combination of lung and fluid.  He did not feel that they had anything to offer regarding drainage.   PLAN:   I had a long discussion today with the patient and his cousin who is the healthcare power of attorney.  I essentially told them that the most definitive operation would be a thoracoscopy or thoracotomy to drain the fluid.  However because there is only a small to moderate amount of fluid present that it was unlikely he was going to have a significant benefit from that since he is essentially asymptomatic at this time.  I also told him that the risks with a thoracotomy would be quite high given his quadriplegia and immobility.  I reviewed with him the options of percutaneous drainage and told him that our radiologist would be the ones to  perform that but that this would be difficult for them to perform.  Finally I reviewed with him the option of continued medical management with follow-up.  The family would like to discuss the options that are available to him.  The patient is desirous of going back to his nursing facility.  They will think about their options and let me know how to proceed.    Alexander Escobar, MDPatient ID: Alexander Escobar, male   DOB: 11/18/1961, 57 y.o.   MRN: [id number]
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
## 2018-08-06 NOTE — Progress Notes (Signed)
Patient refused 1200 CBG. Bo McclintockBrewer,Cedra Villalon S, RN

## 2018-08-07 LAB — GLUCOSE, CAPILLARY
GLUCOSE-CAPILLARY: 107 mg/dL — AB (ref 70–99)
GLUCOSE-CAPILLARY: 131 mg/dL — AB (ref 70–99)
Glucose-Capillary: 111 mg/dL — ABNORMAL HIGH (ref 70–99)

## 2018-08-07 MED ORDER — VANCOMYCIN IV (FOR PTA / DISCHARGE USE ONLY): 1000.0000 mg | INTRAVENOUS | 0 refills | Status: AC

## 2018-08-07 MED ORDER — FREE WATER: 180.0000 mL | Freq: Every day | 0 refills | Status: AC

## 2018-08-07 MED ORDER — HYDROCODONE-ACETAMINOPHEN 10-325 MG PO TABS: 1.0000 | ORAL_TABLET | Freq: Four times a day (QID) | ORAL | 0 refills | Status: DC | PRN

## 2018-08-07 NOTE — Progress Notes (Signed)
PHARMACY CONSULT NOTE FOR:  OUTPATIENT  PARENTERAL ANTIBIOTIC THERAPY (OPAT)  Indication: MRSA pneumonia Regimen: Vancomycin 1 gm IV Q24H End date: 08/31/18  CBC with differential every Monday, and BMP with vanc trough every Monday and Thursday  IV antibiotic discharge orders are pended. To discharging provider:  please sign these orders via discharge navigator,  Select New Orders & click on the button choice - Manage This Unsigned Work.     Thank you for allowing pharmacy to be a part of this patient's care.  Carola FrostNathan A Crew Goren, Pharm.D., BCPS Clinical Pharmacist 08/07/2018, 11:11 AM

## 2018-08-07 NOTE — Discharge Summary (Signed)
[facility] - [place] at [facility]   PATIENT NAME: Alexander Escobar    MR#:  [id number]  DATE OF BIRTH:  [birth year]  DATE OF ADMISSION:  08/01/2018 ADMITTING PHYSICIAN: Enid Baas, MD  DATE OF DISCHARGE: 08/07/2018   PRIMARY CARE PHYSICIAN: Charlott Rakes, MD    ADMISSION DIAGNOSIS:  Healthcare-associated pneumonia [J18.9] Sepsis, due to unspecified organism (HCC) [A41.9] Anemia, unspecified type [D64.9]  DISCHARGE DIAGNOSIS:  Active Problems:   Healthcare-associated pneumonia   Pneumonia of left lower lobe due to methicillin-resistant Staphylococcus aureus (MRSA) (HCC)   Empyema of left pleural space (HCC)   Recurrent pleural effusion on left   SECONDARY DIAGNOSIS:   Past Medical History:  Diagnosis Date  . Congenital absence of external auditory canal   . Gastroesophageal reflux disease   . Presence of permanent cardiac pacemaker   . Quadriplegia (HCC)   . Recurrent major depression (HCC)   . Severe protein-calorie malnutrition (HCC)     HOSPITAL COURSE:   *Bilateral pneumonia with acute hypoxic respiratory failure sepsis present on admission Most likely aspiration pneumonia due to patient's dysphagia. Patient on cefepime, Flagyl.  Vancomycin added due to gram-positive cocci in sputum and MRSA PCR positive. Sputum cx reports MRSA, xray shows worsening and some possibility of loculated effusion. Consulted Thorasic surgery and ID.  Stopped cefepime and flagyl as MRSA in sputum cx. As the effusion is loculated interventional radiologist could not help much.  Dr. Thelma Barge had seen the patient and suggested thoracoscopy and decortication would be very risky because of patient's baseline conditions like quadriplegia and he does not have much fluid. He suggested to treat with IV antibiotic and follow-up.  Patient and his POA had agreed on that option, I will order PICC line placement- ABx for 4 weeks.  * parapneumonic effusion   Appreciated  help by Thorasic surgery   US guided tap tried, but highly loculated fluid, no drainage done.  *Worsening anemia over chronic anemia.  GI consulted due to history of melena intermittently.  Recommended no endoscopy procedures at this time and anemia start to be likely from chronic disease.  *Dysphagia with PEG tube in place. Resumed tube feeds.   As tube was leaking, tightened, and checked by Xray - proper position now.  resume feeding.  * Anxiety- ativan changed to xanax per pt and family request. DISCHARGE CONDITIONS:   Stable.  CONSULTS OBTAINED:  Treatment Team:  Lynn Ito, MD Hulda Marin, MD Altamese Dilling, MD  DRUG ALLERGIES:   Allergies  Allergen Reactions  . Ciprofloxacin Swelling  . Contrast Media [Iodinated Diagnostic Agents] Other (See Comments)    Constipation   . Penicillins Rash    Has patient had a PCN reaction causing immediate rash, facial/tongue/throat swelling, SOB or lightheadedness with hypotension: Unknown Has patient had a PCN reaction causing severe rash involving mucus membranes or skin necrosis: Unknown Has patient had a PCN reaction that required hospitalization: Unknown Has patient had a PCN reaction occurring within the last 10 years: Unknown If all of the above answers are "NO", then may proceed with Cephalosporin use.     DISCHARGE MEDICATIONS:   Allergies as of 08/07/2018      Reactions   Ciprofloxacin Swelling   Contrast Media [iodinated Diagnostic Agents] Other (See Comments)   Constipation    Penicillins Rash   Has patient had a PCN reaction causing immediate rash, facial/tongue/throat swelling, SOB or lightheadedness with hypotension: Unknown Has patient had a PCN reaction causing severe rash involving mucus membranes or  skin necrosis: Unknown Has patient had a PCN reaction that required hospitalization: Unknown Has patient had a PCN reaction occurring within the last 10 years: Unknown If all of the above  answers are "NO", then may proceed with Cephalosporin use.      Medication List    TAKE these medications   acetaminophen 325 MG tablet Commonly known as:  TYLENOL Take 650 mg by mouth every 6 (six) hours as needed for mild pain or moderate pain.   ALPRAZolam 0.5 MG tablet Commonly known as:  XANAX Place 1 tablet (0.5 mg total) into feeding tube at bedtime.   ascorbic acid 500 MG tablet Commonly known as:  VITAMIN C Place 500 mg into feeding tube daily.   baclofen 10 MG tablet Commonly known as:  LIORESAL Place 1-2 tablets (10-20 mg total) into feeding tube 2 (two) times daily. Take 10 mg by mouth in the morning and 20 mg by mouth at bedtime.   bisacodyl 10 MG suppository Commonly known as:  DULCOLAX Place 10 mg rectally daily.   diphenhydrAMINE 12.5 MG/5ML elixir Commonly known as:  BENADRYL Place 10 mLs (25 mg total) into feeding tube every 8 (eight) hours as needed for allergies.   docusate 50 MG/5ML liquid Commonly known as:  COLACE Place 200 mg into feeding tube at bedtime.   doxepin 10 MG capsule Commonly known as:  SINEQUAN Place 10 mg into feeding tube at bedtime.   feeding supplement (JEVITY 1.2 CAL) Liqd Place 1,000 mLs into feeding tube continuous.   feeding supplement (PRO-STAT SUGAR FREE 64) Liqd Place 30 mLs into feeding tube 3 (three) times daily. What changed:  when to take this   Ferrous Sulfate 5 MG/20ML Liqd 325 mg by PEG Tube route 3 (three) times daily with meals.   FLUoxetine 20 MG/5ML solution Commonly known as:  PROZAC Place 20 mg into feeding tube daily.   free water Soln Place 180 mLs into feeding tube 6 (six) times daily.   furosemide 20 MG tablet Commonly known as:  LASIX Place 20 mg into feeding tube daily.   HYDROcodone-acetaminophen 10-325 MG tablet Commonly known as:  NORCO Place 1 tablet into feeding tube every 6 (six) hours as needed for moderate pain.   ipratropium-albuterol 0.5-2.5 (3) MG/3ML Soln Commonly known as:   DUONEB Take 3 mLs by nebulization every 4 (four) hours as needed (shortness of breath).   loratadine 10 MG tablet Commonly known as:  CLARITIN Place 10 mg into feeding tube daily.   magnesium hydroxide 400 MG/5ML suspension Commonly known as:  MILK OF MAGNESIA Place 30 mLs into feeding tube 2 (two) times daily as needed for mild constipation.   Melatonin 3 MG Tabs Place 1 tablet into feeding tube at bedtime.   multivitamin Liqd Place 15 mLs into feeding tube daily.   OSTEO BI-FLEX ADV JOINT SHIELD PO 1 tablet by PEG Tube route daily.   pantoprazole sodium 40 mg/20 mL Pack Commonly known as:  PROTONIX Place 20 mLs (40 mg total) into feeding tube 2 (two) times daily.   polyethylene glycol packet Commonly known as:  MIRALAX / GLYCOLAX Place 17 g into feeding tube daily.   ranitidine 150 MG tablet Commonly known as:  ZANTAC Take 150 mg by mouth 2 (two) times daily.   sennosides 8.8 MG/5ML syrup Commonly known as:  SENOKOT Place 10 mLs into feeding tube at bedtime.   shark liver oil-cocoa butter 0.25-3-85.5 % suppository Commonly known as:  PREPARATION H Place 1 suppository rectally every  12 (twelve) hours as needed (rectal bleeding).   vancomycin  IVPB Inject 1,000 mg into the vein daily for 24 days. Indication:  MRSA pneumonia Last Day of Therapy:  08/31/18 Labs - Every Monday:  CBC/D Labs - Every Monday and Thursday:  BMP and vancomycin trough            Home Infusion Instuctions  (From admission, onward)         Start     Ordered   08/07/18 0000  Home infusion instructions [facility] May follow [facility] Dosing Protocol; May administer Cathflo as needed to maintain patency of vascular access device.; Flushing of vascular access device: per [facility] Protocol: 0.9% NaCl pre/post medica...    Question Answer Comment  Instructions [facility] follow [facility] Dosing [facility]   Instructions May administer Cathflo as needed to maintain patency of vascular access  device.   Instructions Flushing of vascular access device: per [facility] Protocol: 0.9% NaCl pre/post medication administration and prn patency; Heparin 100 u/ml, 5ml for implanted ports and Heparin 10u/ml, 5ml for all other central venous catheters.   Instructions May follow [facility] Anaphylaxis Protocol for First Dose Administration in the home: 0.9% NaCl at 25-50 ml/hr to maintain IV access for protocol meds. Epinephrine 0.3 ml IV/IM PRN and Benadryl 25-50 IV/IM PRN s/s of anaphylaxis.   Instructions [facility] Infusion Coordinator (RN) to assist per patient IV care needs in the home PRN.      08/07/18 1125           DISCHARGE INSTRUCTIONS:    MRSA pneumonia and Empyema  Baseline Creatinine on 08/06/18 0.88 Vanco trough on 8/19 is 20  Culture Result: MRSA in sputum   OPAT Orders Discharge antibiotics: Vancomycin  1000mg  IVPB every 24 hrs Per pharmacy protocol : consult your pharmacist to adjust dose according to trough level Aim for Vancomycin trough 15-20  Duration: 25 more days  End Date:08/31/18   [facility] Per Protocol:  Labs weekly while on IV antibiotics: _X_ CBC with differential on Monday _X_ BMP every Monday and Thursday __X Vancomycin trough every Monday and Thursday  CXR in 2 weeks   Please pull PIC at completion of IV antibiotics  Call Dr.Ravishankar with any questions or critical value at [phone]  If you experience worsening of your admission symptoms, develop shortness of breath, life threatening emergency, suicidal or homicidal thoughts you must seek medical attention immediately by calling 911 or calling your MD immediately  if symptoms less severe.  You Must read complete instructions/literature along with all the possible adverse reactions/side effects for all the Medicines you take and that have been prescribed to you. Take any new Medicines after you have completely understood and accept all the possible adverse reactions/side effects.    Please note  You were cared for by a hospitalist during your hospital stay. If you have any questions about your discharge medications or the care you received while you were in the hospital after you are discharged, you can call the unit and asked to speak with the hospitalist on call if the hospitalist that took care of you is not available. Once you are discharged, your primary care physician will handle any further medical issues. Please note that NO REFILLS for any discharge medications will be authorized once you are discharged, as it is imperative that you return to your primary care physician (or establish a relationship with a primary care physician if you do not have one) for your aftercare needs  so that they can reassess your need for medications and monitor your lab values.    Today   CHIEF COMPLAINT:   Chief Complaint  Patient presents with  . Respiratory Distress    HISTORY OF PRESENT ILLNESS:  Alston Berrie  is a 57 y.o. male with a known history of Dysphagia, PEG, Quadriplegia here for SOB and weakness.  Patient placed on nonrebreather initially and later transitioned to 3 L oxygen.  Patient has chronic dysphagia with quadriplegia and gets tube feeds through PEG tube.  Patient continues to complain of shortness of breath.  No sputum.  He has had recurrent melena alternating with regular stools for the past few days.  No abdominal pain.  Tolerating tube feeds.  Afebrile.  Found to have sepsis in the emergency room.    VITAL SIGNS:  Blood pressure 131/68, pulse 83, temperature 98.6 F (37 C), temperature source Oral, resp. rate 17, height 5\' 6"  (1.676 m), weight 74 kg, SpO2 95 %.  I/O:    Intake/Output Summary (Last 24 hours) at 08/07/2018 1410 Last data filed at 08/06/2018 1849 Gross per 24 hour  Intake 417 ml  Output 1200 ml  Net -783 ml    PHYSICAL EXAMINATION:   GENERAL:  57 y.o.-year-old patient lying in the bed with no acute distress.  EYES: Pupils equal,  round, reactive to light and accommodation. No scleral icterus. Extraocular muscles intact.  HEENT: Head atraumatic, normocephalic. Oropharynx and nasopharynx clear.  NECK:  Supple, no jugular venous distention. No thyroid enlargement, no tenderness.  LUNGS: Bilateral decreased breath sounds CARDIOVASCULAR: S1, S2 normal. No murmurs, rubs, or gallops.  ABDOMEN: Soft, nontender, nondistended. Bowel sounds present. No organomegaly or mass.  PEG tube in place EXTREMITIES: No cyanosis, clubbing or edema b/l.   Diffuse muscle wasting. NEUROLOGIC: Quadriplegia.  Atrophic limbs PSYCHIATRIC: The patient is alert and oriented x 3.  SKIN: Sacral decubitus ulcer  DATA REVIEW:   CBC Recent Labs  Lab 08/06/18 0459  WBC 12.5*  HGB 8.6*  HCT 26.9*  PLT 733*    Chemistries  Recent Labs  Lab 08/06/18 0459  NA 145  K 4.3  CL 110  CO2 31  GLUCOSE 88  BUN 28*  CREATININE 0.88  CALCIUM 8.0*  AST 18  ALT 12  ALKPHOS 63  BILITOT 0.6    Cardiac Enzymes Recent Labs  Lab 08/03/18 1721  TROPONINI <0.03    Microbiology Results  Results for orders placed or performed during the hospital encounter of 08/01/18  Culture, blood (Routine x 2)     Status: None   Collection Time: 08/01/18  6:18 AM  Result Value Ref Range Status   Specimen Description BLOOD LEFT FATTY CASTS  Final   Special Requests   Final    BOTTLES DRAWN AEROBIC AND ANAEROBIC Blood Culture adequate volume   Culture   Final    NO GROWTH 5 DAYS Performed at [facility], [address], [place], [place] [address]    Report Status 08/06/2018 FINAL  Final  Culture, blood (Routine x 2)     Status: None   Collection Time: 08/01/18  6:18 AM  Result Value Ref Range Status   Specimen Description BLOOD RIGHT HAND  Final   Special Requests   Final    BOTTLES DRAWN AEROBIC AND ANAEROBIC Blood Culture adequate volume   Culture   Final    NO GROWTH 5 DAYS Performed at [facility], [address],  [place], [place] [address]  Report Status 08/06/2018 FINAL  Final  Urine Culture     Status: None   Collection Time: 08/01/18  6:18 AM  Result Value Ref Range Status   Specimen Description   Final    URINE, RANDOM Performed at [facility], [address] [place], [place] [address]    Special Requests   Final    Normal Performed at [facility], [address] [place], [place] [address]    Culture   Final    NO GROWTH Performed at [facility], [address] [place], [place] [address]    Report Status 08/03/2018 FINAL  Final  MRSA PCR Screening     Status: Abnormal   Collection Time: 08/01/18  7:49 PM  Result Value Ref Range Status   MRSA by PCR POSITIVE (A) NEGATIVE Final    Comment:        The GeneXpert MRSA Assay (FDA approved for NASAL specimens only), is one component of a comprehensive MRSA colonization surveillance program. It is not intended to diagnose MRSA infection nor to guide or monitor treatment for MRSA infections. RESULT CALLED TO, READ BACK BY AND VERIFIED WITH: [facility] LAMPH[facility] RN AT 2115 08/01/18.MSS Performed at [facility], [address] [place], [place] [address]   Culture, sputum-assessment     Status: None   Collection Time: 08/01/18  8:04 PM  Result Value Ref Range Status   Specimen Description SPUTUM  Final   Special Requests NONE  Final   Sputum evaluation   Final    THIS SPECIMEN IS ACCEPTABLE FOR SPUTUM CULTURE Performed at [facility], [address] [place], [place] [address]    Report Status 08/01/2018 FINAL  Final  Culture, respiratory     Status: None   Collection Time: 08/01/18  8:04 PM  Result Value Ref Range Status   Specimen Description   Final    SPUTUM Performed at [facility], [address] [place], [place] [address]    Special Requests   Final    NONE Reflexed from [id number] Performed at [facility], [address] [place], [place] [address]     Gram Stain   Final    ABUNDANT WBC PRESENT, PREDOMINANTLY PMN FEW GRAM POSITIVE COCCI Performed at [facility], [address] [place], [place] [address]    Culture FEW METHICILLIN RESISTANT STAPHYLOCOCCUS AUREUS  Final   Report Status 08/04/2018 FINAL  Final   Organism ID, Bacteria METHICILLIN RESISTANT STAPHYLOCOCCUS AUREUS  Final      Susceptibility   Methicillin resistant staphylococcus aureus - MIC*    CIPROFLOXACIN >=8 RESISTANT Resistant     ERYTHROMYCIN >=8 RESISTANT Resistant     GENTAMICIN >=16 RESISTANT Resistant     OXACILLIN >=4 RESISTANT Resistant     TETRACYCLINE >=16 RESISTANT Resistant     VANCOMYCIN 1 SENSITIVE Sensitive     TRIMETH/SULFA 20 SENSITIVE Sensitive     CLINDAMYCIN RESISTANT Resistant     RIFAMPIN <=0.5 SENSITIVE Sensitive     Inducible Clindamycin POSITIVE Resistant     * FEW METHICILLIN RESISTANT STAPHYLOCOCCUS AUREUS    RADIOLOGY:  Ct Chest Wo Contrast  Result Date: 08/06/2018 CLINICAL DATA:  Bilateral pneumonia with acute hypoxic respiratory failure and sepsis. EXAM: CT CHEST WITHOUT CONTRAST TECHNIQUE: Multidetector CT imaging of the chest was performed following the standard protocol without IV contrast. COMPARISON:  Chest x-rays dated 08/03/2018 and 08/01/2018 FINDINGS: Cardiovascular: Heart size is normal. Ascending thoracic aorta is dilated to  a diameter of approximately 3.9 cm. Coronary artery calcifications. No pericardial effusion. Pacemaker in place. Mediastinum/Nodes: Several small precarinal lymph nodes, most likely reactive. Benign-appearing calcifications in the right lobe of the thyroid gland. Esophagus and trachea appear normal. Lungs/Pleura: There is consolidation and compressive atelectasis of the entire left lower lobe. There is focal consolidation and compressive atelectasis in the posterior aspect of the right lower lobe. There are small patchy areas of infiltrate in the right upper lobe. There is a large loculated left pleural  effusion and a moderate right pleural effusion. Ultrasound appearance on 08/05/2018 demonstrated extensive internal septations in the fluid collection on the left consistent with an empyema. Upper Abdomen: There is marked chronic hydronephrosis of the left kidney with severe thinning of the renal cortex. Multiple stones in the right kidney. IVC filter in place. Gastrostomy tube in place, incompletely visualized. Musculoskeletal: No acute bone abnormality. Cervicothoracic surgical fusion. Chronic accentuation of the thoracic kyphosis. The posterior elements of the entire thoracic spine are diffusely fused. IMPRESSION: 1. Bilateral lower lobe pneumonia, more extensive on the left than the right. Small patchy areas of pneumonia in the right upper lobe. 2. Large left empyema. 3. Moderate right pleural effusion which may also represent an empyema. 4. Chronic severe hydronephrosis of the left kidney. Electronically Signed   By: Francene Boyers M.D.   On: 08/06/2018 10:39   [place] Chest (pleural Effusion)  Result Date: 08/05/2018 CLINICAL DATA:  57 year old quadriplegic male with a history of frequent pneumonia is an recurrent left-sided pleural effusions. He presents today for attempted thoracentesis. EXAM: CHEST ULTRASOUND COMPARISON:  Chest x-ray 08/03/2018 FINDINGS: The left chest was interrogated with ultrasound. There is a multiloculated and highly complex pleural effusion. The individual fluid pockets are very small in size. Given that the patient is currently clinically asymptomatic, thoracentesis was deferred. IMPRESSION: Highly complex/loculated left-sided pleural effusion without dominant fluid pocket. Thoracentesis would be highly unlikely to provide adequate drainage and was therefore deferred. Electronically Signed   By: Malachy Moan M.D.   On: 08/05/2018 16:22   [place] Ekg Site Rite  Result Date: 08/06/2018 If Site Rite image not attached, placement could not be confirmed due to current cardiac  rhythm.   EKG:   Orders placed or performed during the hospital encounter of 08/01/18  . EKG 12-Lead  . EKG 12-Lead  . ED EKG  . ED EKG  . EKG 12-Lead  . EKG 12-Lead      Management plans discussed with the patient, family and they are in agreement.  CODE STATUS:  DNR    Code Status Orders  (From admission, onward)         Start     Ordered   08/01/18 0904  Do not attempt resuscitation (DNR)  Continuous    Question Answer Comment  In the event of cardiac or respiratory ARREST Do not call a "code blue"   In the event of cardiac or respiratory ARREST Do not perform Intubation, CPR, defibrillation or ACLS   In the event of cardiac or respiratory ARREST Use medication by any route, position, wound care, and other measures to relive pain and suffering. May use oxygen, suction and manual treatment of airway obstruction as needed for comfort.      08/01/18 0905        Code Status History    Date Active Date Inactive Code Status Order ID Comments User Context   05/18/2018 1219 05/19/2018 2041 Full Code [id number]  Ihor Austin, MD ED  07/05/2017 1401 07/08/2017 1408 DNR [id number]  Enedina Finner, MD Inpatient   07/03/2017 1555 07/05/2017 1401 Full Code [id number]  Altamese Dilling, MD Inpatient    Advance Directive Documentation     Most Recent Value  Type of Advance Directive  Healthcare Power of Attorney, Living will  Pre-existing out of facility DNR order (yellow form or pink MOST form)  -  "MOST" Form in Place?  -      TOTAL TIME TAKING CARE OF THIS PATIENT: 35 minutes.    Altamese Dilling M.D on 08/07/2018 at 2:10 PM  Between 7am to 6pm - Pager - [phone]  After 6pm go to [email or web address] - password [email or web address]  [facility]  Office  [phone]  CC: Primary care physician; Charlott Rakes, MD   Note: This dictation was prepared with Dragon dictation along with smaller phrase technology. Any transcriptional errors that result from this  process are unintentional.

## 2018-08-07 NOTE — Clinical Social Work Note (Signed)
Patient is medically ready for discharge today back to [facility] today. CSW notified patient and cousin Milderd MeagerCheryl Lee in room of patient discharge. CSW also notified Tresa EndoKelly, admissions coordinator at [facility] of patient discharge. Patient will be transported by EMS. RN to call report and call for EMS.   Ruthe Mannanandace Hartlyn Reigel MSW, [address] [phone]

## 2018-08-07 NOTE — Progress Notes (Signed)
Pt D/C to [facility] via EMS. Report given to Tiffany at facility. Foley emptied upon transport. VSS. PICC left in place for in facility ABX. Tele removed. Family at bedside at time of transfer. All belongs sent with family.

## 2018-08-12 ENCOUNTER — Emergency Department
Admission: EM | Admit: 2018-08-12 | Discharge: 2018-08-12 | Disposition: A | Discharge status: Skilled Nursing Facility | Payer: Medicaid Other | Attending: Emergency Medicine | Admitting: Emergency Medicine

## 2018-08-12 ENCOUNTER — Ambulatory Visit (INDEPENDENT_AMBULATORY_CARE_PROVIDER_SITE_OTHER): Payer: Medicaid Other | Admitting: Gastroenterology

## 2018-08-12 VITALS — BP 110/63 | HR 61

## 2018-08-12 DIAGNOSIS — R5383 Other fatigue: Secondary | ICD-10-CM

## 2018-08-12 DIAGNOSIS — R4182 Altered mental status, unspecified: Secondary | ICD-10-CM | POA: Diagnosis present

## 2018-08-12 DIAGNOSIS — Z87891 Personal history of nicotine dependence: Secondary | ICD-10-CM | POA: Insufficient documentation

## 2018-08-12 DIAGNOSIS — Z79899 Other long term (current) drug therapy: Secondary | ICD-10-CM | POA: Insufficient documentation

## 2018-08-12 DIAGNOSIS — E860 Dehydration: Secondary | ICD-10-CM | POA: Insufficient documentation

## 2018-08-12 LAB — BASIC METABOLIC PANEL
Anion gap: 5 (ref 5–15)
BUN: 37 mg/dL — AB (ref 6–20)
CO2: 28 mmol/L (ref 22–32)
CREATININE: 0.87 mg/dL (ref 0.61–1.24)
Calcium: 7.9 mg/dL — ABNORMAL LOW (ref 8.9–10.3)
Chloride: 104 mmol/L (ref 98–111)
GFR calc Af Amer: >60 mL/min (ref >60)
Glucose, Bld: 84 mg/dL (ref 70–99)
POTASSIUM: 4.3 mmol/L (ref 3.5–5.1)
Sodium: 137 mmol/L (ref 135–145)

## 2018-08-12 LAB — CBC WITH DIFFERENTIAL/PLATELET
BASOS ABS: 0 10*3/uL (ref 0–0.1)
Basophils Relative: 0 %
EOS ABS: 0.5 10*3/uL (ref 0–0.7)
EOS PCT: 4 %
HCT: 26.9 % — ABNORMAL LOW (ref 40.0–52.0)
Hemoglobin: 8.7 g/dL — ABNORMAL LOW (ref 13.0–18.0)
LYMPHS PCT: 6 %
Lymphs Abs: 0.7 10*3/uL — ABNORMAL LOW (ref 1.0–3.6)
MCH: 26.6 pg (ref 26.0–34.0)
MCHC: 32.5 g/dL (ref 32.0–36.0)
MCV: 81.7 fL (ref 80.0–100.0)
Monocytes Absolute: 1.8 10*3/uL — ABNORMAL HIGH (ref 0.2–1.0)
Monocytes Relative: 14 %
NEUTROS PCT: 76 %
Neutro Abs: 9.4 10*3/uL — ABNORMAL HIGH (ref 1.4–6.5)
PLATELETS: 517 10*3/uL — AB (ref 150–440)
RBC: 3.29 MIL/uL — AB (ref 4.40–5.90)
RDW: 19.7 % — ABNORMAL HIGH (ref 11.5–14.5)
WBC: 12.4 10*3/uL — AB (ref 3.8–10.6)

## 2018-08-12 LAB — GLUCOSE, CAPILLARY: GLUCOSE-CAPILLARY: 76 mg/dL (ref 70–99)

## 2018-08-12 MED ORDER — SODIUM CHLORIDE 0.9 % IV BOLUS
500.0000 mL | Freq: Once | INTRAVENOUS | Status: AC
  Administered 2018-08-12: 500 mL via INTRAVENOUS

## 2018-08-12 NOTE — Discharge Instructions (Signed)
Your labs today were unremarkable..  To be somewhat dehydrated due to fluid restriction and Lasix use so we gave you some extra fluids today.  Please follow-up with your primary care doctor and gastroenterologist for further monitoring of your symptoms.

## 2018-08-12 NOTE — ED Notes (Signed)
[facility]  CALL[facility] FOR  TRANSPORT  TO  [facility] 

## 2018-08-12 NOTE — ED Triage Notes (Signed)
Pt came to ED via EMS. Pt was picked up by [facility] to be brought to GI clinic when pt started not responding. Upon arrival to ED pt can state name, date of birth and where he is. Pt quadriplegic, recently discharged from hospital on Wednesday. Reports he has MRSA and is currently taking antibiotics.

## 2018-08-12 NOTE — ED Provider Notes (Signed)
[facility] Emergency Department Provider Note  ____________________________________________  Time seen: Approximately 4:42 PM  I have reviewed the triage vital signs and the nursing notes.   HISTORY  Chief Complaint Altered Mental Status    HPI Alexander Escobar is a 57 y.o. male with a history of GERD, pacemaker, quadriplegia and recent hospitalization for MRSA pneumonia who was sent to the ED from GI clinic due to a brief period of unresponsiveness.   Patient reports he was not having any acute symptoms today and has been tolerating his tube feeds through his gastrostomy and receiving IV antibiotics through his right upper extremity PICC line.  He denies any acute complaints except for feeling tired.  When daughter arrived to bedside she notes that he is on fluid restriction 1500 mL's per day as well as Lasix.  She thinks he is dehydrated.  Patient denies headache vision change nausea vomiting or pain.    Past Medical History:  Diagnosis Date  . Congenital absence of external auditory canal   . Gastroesophageal reflux disease   . Presence of permanent cardiac pacemaker   . Quadriplegia (HCC)   . Recurrent major depression (HCC)   . Severe protein-calorie malnutrition [facility](HCC)      Patient Active Problem List   Diagnosis Date Noted  . Pneumonia of left lower lobe due to methicillin-resistant Staphylococcus aureus (MRSA) (HCC)   . Empyema of left pleural space (HCC)   . Recurrent pleural effusion on left   . Iron deficiency anemia due to chronic blood loss   . Dark stools   . GI bleed 05/18/2018  . Pressure injury of skin 07/04/2017  . Sepsis (HCC) 07/03/2017  . UTI (urinary tract infection) due to urinary indwelling Foley catheter (HCC) 07/03/2017  . Healthcare-associated pneumonia 07/03/2017     Past Surgical History:  Procedure Laterality Date  . ESOPHAGOGASTRODUODENOSCOPY N/A 05/19/2018   Procedure: ESOPHAGOGASTRODUODENOSCOPY (EGD);  Surgeon:  Toney ReilVanga, Rohini Reddy, MD;  [facility] END[facility]COPY;  Service: Gastroenterology;  Laterality: N/A;  . PEG TUBE PLACEMENT    . SUPRAPUBIC CATHETER INSERTION       Prior to Admission medications   Medication Sig Start Date End Date Taking? Authorizing Provider  acetaminophen (TYLENOL) 325 MG tablet Take 650 mg by mouth every 6 (six) hours as needed for mild pain or moderate pain.    [provider]  ALPRAZolam Prudy Feeler(XANAX) 0.5 MG tablet Place 1 tablet (0.5 mg total) into feeding tube at bedtime. 05/19/18   Gouru, Deanna ArtisAruna, MD  Amino Acids-Protein Hydrolys (FEEDING SUPPLEMENT, PRO-STAT SUGAR FREE 64,) LIQD Place 30 mLs into feeding tube 3 (three) times daily. Patient taking differently: Place 30 mLs into feeding tube daily.  07/08/17   Enedina FinnerPatel, Sona, MD  ascorbic acid (VITAMIN C) 500 MG tablet Place 500 mg into feeding tube daily.    [provider]  baclofen (LIORESAL) 10 MG tablet Place 1-2 tablets (10-20 mg total) into feeding tube 2 (two) times daily. Take 10 mg by mouth in the morning and 20 mg by mouth at bedtime. 05/19/18   Gouru, Deanna ArtisAruna, MD  bisacodyl (DULCOLAX) 10 MG suppository Place 10 mg rectally daily.    [provider]  diphenhydrAMINE (BENADRYL) 12.5 MG/5ML elixir Place 10 mLs (25 mg total) into feeding tube every 8 (eight) hours as needed for allergies. 05/19/18   Gouru, Deanna ArtisAruna, MD  docusate (COLACE) 50 MG/5ML liquid Place 200 mg into feeding tube at bedtime.    [provider]  doxepin (SINEQUAN) 10 MG capsule  Place 10 mg into feeding tube at bedtime.     [provider]  Ferrous Sulfate 5 MG/20ML LIQD 325 mg by PEG Tube route 3 (three) times daily with meals. 05/19/18 08/20/18  Ramonita Lab, MD  FLUoxetine (PROZAC) 20 MG/5ML solution Place 20 mg into feeding tube daily.     [provider]  furosemide (LASIX) 20 MG tablet Place 20 mg into feeding tube daily.     [provider]  HYDROcodone-acetaminophen (NORCO) 10-325 MG tablet Place 1  tablet into feeding tube every 6 (six) hours as needed for moderate pain. 08/07/18   Altamese Dilling, MD  ipratropium-albuterol (DUONEB) 0.5-2.5 (3) MG/3ML SOLN Take 3 mLs by nebulization every 4 (four) hours as needed (shortness of breath).     [provider]  loratadine (CLARITIN) 10 MG tablet Place 10 mg into feeding tube daily.    [provider]  magnesium hydroxide (MILK OF MAGNESIA) 400 MG/5ML suspension Place 30 mLs into feeding tube 2 (two) times daily as needed for mild constipation.     [provider]  Melatonin 3 MG TABS Place 1 tablet into feeding tube at bedtime.     [provider]  Misc Natural Products (OSTEO BI-FLEX ADV JOINT SHIELD PO) 1 tablet by PEG Tube route daily.     [provider]  Multiple Vitamin (MULTIVITAMIN) LIQD Place 15 mLs into feeding tube daily.     [provider]  Nutritional Supplements (FEEDING SUPPLEMENT, JEVITY 1.2 CAL,) LIQD Place 1,000 mLs into feeding tube continuous. 05/19/18   Ramonita Lab, MD  pantoprazole sodium (PROTONIX) 40 mg/20 mL PACK Place 20 mLs (40 mg total) into feeding tube 2 (two) times daily. 05/19/18   Gouru, Deanna Artis, MD  polyethylene glycol (MIRALAX / GLYCOLAX) packet Place 17 g into feeding tube daily.     [provider]  ranitidine (ZANTAC) 150 MG tablet Take 150 mg by mouth 2 (two) times daily.    [provider]  sennosides (SENOKOT) 8.8 MG/5ML syrup Place 10 mLs into feeding tube at bedtime.    [provider]  shark liver oil-cocoa butter (PREPARATION H) 0.25-3-85.5 % suppository Place 1 suppository rectally every 12 (twelve) hours as needed (rectal bleeding).    [provider]  vancomycin IVPB Inject 1,000 mg into the vein daily for 24 days. Indication:  MRSA pneumonia Last Day of Therapy:  08/31/18 Labs - Every Monday:  CBC/D Labs - Every Monday and Thursday:  BMP and vancomycin trough 08/07/18 08/31/18  Altamese Dilling, MD  Water  For Irrigation, Sterile (FREE WATER) SOLN Place 180 mLs into feeding tube 6 (six) times daily. 08/07/18   Altamese Dilling, MD     Allergies Ciprofloxacin; Contrast media [iodinated diagnostic agents]; and Penicillins   Family History  Problem Relation Age of Onset  . CAD Mother   . CAD Father     Social History Social History   Tobacco Use  . Smoking status: Former Games developer  . Smokeless tobacco: Never Used  Substance Use Topics  . Alcohol use: No  . Drug use: No    Review of Systems  Constitutional:   No fever or chills.  ENT:   No sore throat. No rhinorrhea. Cardiovascular:   No chest pain or syncope. Respiratory:   No dyspnea or cough. Gastrointestinal:   Negative for abdominal pain, vomiting and diarrhea.  Musculoskeletal:   Negative for focal pain or swelling All other systems reviewed and are negative except as documented above in ROS and  HPI.  ____________________________________________   PHYSICAL EXAM:  VITAL SIGNS: ED Triage Vitals  Enc Vitals Group     BP 08/12/18 1205 93/61     Pulse Rate 08/12/18 1205 76     Resp 08/12/18 1205 18     Temp 08/12/18 1205 97.8 F (36.6 C)     Temp Source 08/12/18 1205 Axillary     SpO2 08/12/18 1205 95 %     Weight 08/12/18 1206 155 lb (70.3 kg)     Height 08/12/18 1206 5\' 6"  (1.676 m)     Head Circumference --      Peak Flow --      Pain Score --      Pain Loc --      Pain Edu? --      Excl. in GC? --     Vital signs reviewed, nursing assessments reviewed.   Constitutional:   Alert and oriented. Non-toxic appearance. Eyes:   Conjunctivae are normal. EOMI. PERRL. ENT      Head:   Normocephalic and atraumatic.      Nose:   No congestion/rhinnorhea.       Mouth/Throat:   Dry mucous membranes, no pharyngeal erythema. No peritonsillar mass.       Neck:   No meningismus. Full ROM. Hematological/Lymphatic/Immunilogical:   No cervical lymphadenopathy. Cardiovascular:   RRR. Symmetric bilateral radial and DP  pulses.  No murmurs. Cap refill less than 2 seconds. Respiratory:   Normal respiratory effort without tachypnea/retractions. Breath sounds are clear and equal bilaterally. No wheezes/rales/rhonchi. Gastrointestinal:   Soft and nontender. Non distended. There is no CVA tenderness.  No rebound, rigidity, or guarding.  Musculoskeletal:   Contractures.   Neurologic:   Normal speech and language.  Chronic quadriplegia No acute focal neurologic deficits are appreciated.  Skin:    Skin is warm, dry and intact. No rash noted.  No petechiae, purpura, or bullae.  ____________________________________________    LABS (pertinent positives/negatives) (all labs ordered are listed, but only abnormal results are displayed) Labs Reviewed  BASIC METABOLIC PANEL - Abnormal; Notable for the following components:      Result Value   BUN 37 (*)    Calcium 7.9 (*)    All other components within normal limits  CBC WITH DIFFERENTIAL/PLATELET - Abnormal; Notable for the following components:   WBC 12.4 (*)    RBC 3.29 (*)    Hemoglobin 8.7 (*)    HCT 26.9 (*)    RDW 19.7 (*)    Platelets 517 (*)    Neutro Abs 9.4 (*)    Lymphs Abs 0.7 (*)    Monocytes Absolute 1.8 (*)    All other components within normal limits  GLUCOSE, CAPILLARY  CBG MONITORING, ED   ____________________________________________   EKG  Interpreted by me Atrial paced rhythm, rate of 61, normal axis and intervals.  Normal QRS ST segments and T waves.  Frequent PVCs occurring in a trigeminy pattern.  ____________________________________________    RADIOLOGY  No results found.  ____________________________________________   PROCEDURES Procedures  ____________________________________________  DIFFERENTIAL DIAGNOSIS   Dehydration, anemia, electrolyte disturbance.  Doubt ACS PE dissection AAA operation obstruction biliary disease appendicitis or other acute infectious process.  CLINICAL IMPRESSION / ASSESSMENT AND PLAN  / ED COURSE  Pertinent labs & imaging results that were available during my care of the patient were reviewed by me and considered in my medical decision making (see chart for details).    Patient presents at apparent baseline state of  health, denying any acute complaints.  Had a brief episode of change in mental status back to normal now.  Getting antibiotics and tube feeds.  Does appear to be somewhat dehydrated.  I will check labs.  Clinical Course as of Aug 12 1641  Mon Aug 12, 2018  1252 Stable anemia.  Hemoglobin(!): 8.7 [PS]    Clinical Course User Index [PS] Sharman Cheek, MD     ----------------------------------------- 4:48 PM on 08/12/2018 -----------------------------------------  Labs show elevated BUN, likely due to mild dehydration.  Doubt GI bleed.  Hemoglobin is improved from baseline although he does still have chronic anemia.  Daughter agrees with IV fluids.  I will give him 500 mL's IV and plan to discharge home so he can continue following up with primary care and gastroenterology.  ____________________________________________   FINAL CLINICAL IMPRESSION(S) / ED DIAGNOSES    Final diagnoses:  Fatigue, unspecified type  Dehydration     ED Discharge Orders    None      Portions of this note were generated with dragon dictation software. Dictation errors may occur despite best attempts at proofreading.    Sharman Cheek, MD 08/12/18 [phone]

## 2018-08-12 NOTE — ED Notes (Signed)
Pt alert  family with pt

## 2018-08-12 NOTE — Progress Notes (Signed)
Melodie Bouillon, MD [address]  [address]  [place], [place] [address]  Main: [phone]  Fax: [phone]   Primary Care Physician: Charlott Rakes, MD  Primary Gastroenterologist:  Dr. Melodie Bouillon  Chief complaint: Follow-up for anemia  HPI: Alexander Escobar is a 57 y.o. male here for follow-up of anemia.  He was recently in the hospital for aspiration pneumonia.  Today he is here with his family member.  Patient is somnolent.  Is sleeping in his stretcher, and snoring and breathing, but does not wake up easily to [place] calling his name or tapping his shoulder.  Family states he was conversive earlier but, but has been lethargic since this morning.  Therefore, history is limited, but family states no signs of GI bleeding have been noticed recently.  Current Outpatient Medications  Medication Sig Dispense Refill  . acetaminophen (TYLENOL) 325 MG tablet Take 650 mg by mouth every 6 (six) hours as needed for mild pain or moderate pain.    Marland Kitchen ALPRAZolam (XANAX) 0.5 MG tablet Place 1 tablet (0.5 mg total) into feeding tube at bedtime. 10 tablet 0  . Amino Acids-Protein Hydrolys (FEEDING SUPPLEMENT, PRO-STAT SUGAR FREE 64,) LIQD Place 30 mLs into feeding tube 3 (three) times daily. (Patient taking differently: Place 30 mLs into feeding tube daily. ) 900 mL 0  . ascorbic acid (VITAMIN C) 500 MG tablet Place 500 mg into feeding tube daily.    . baclofen (LIORESAL) 10 MG tablet Place 1-2 tablets (10-20 mg total) into feeding tube 2 (two) times daily. Take 10 mg by mouth in the morning and 20 mg by mouth at bedtime. 15 each 0  . bisacodyl (DULCOLAX) 10 MG suppository Place 10 mg rectally daily.    . diphenhydrAMINE (BENADRYL) 12.5 MG/5ML elixir Place 10 mLs (25 mg total) into feeding tube every 8 (eight) hours as needed for allergies. 120 mL 0  . docusate (COLACE) 50 MG/5ML liquid Place 200 mg into feeding tube at bedtime.    Marland Kitchen doxepin (SINEQUAN) 10 MG capsule Place 10 mg into  feeding tube at bedtime.     . Ferrous Sulfate 5 MG/20ML LIQD 325 mg by PEG Tube route 3 (three) times daily with meals. 5000 mL 10  . FLUoxetine (PROZAC) 20 MG/5ML solution Place 20 mg into feeding tube daily.     . furosemide (LASIX) 20 MG tablet Place 20 mg into feeding tube daily.     Marland Kitchen HYDROcodone-acetaminophen (NORCO) 10-325 MG tablet Place 1 tablet into feeding tube every 6 (six) hours as needed for moderate pain. 15 tablet 0  . ipratropium-albuterol (DUONEB) 0.5-2.5 (3) MG/3ML SOLN Take 3 mLs by nebulization every 4 (four) hours as needed (shortness of breath).     . loratadine (CLARITIN) 10 MG tablet Place 10 mg into feeding tube daily.    . magnesium hydroxide (MILK OF MAGNESIA) 400 MG/5ML suspension Place 30 mLs into feeding tube 2 (two) times daily as needed for mild constipation.     . Melatonin 3 MG TABS Place 1 tablet into feeding tube at bedtime.     . Misc Natural Products (OSTEO BI-FLEX ADV JOINT SHIELD PO) 1 tablet by PEG Tube route daily.     . Multiple Vitamin (MULTIVITAMIN) LIQD Place 15 mLs into feeding tube daily.     . Nutritional Supplements (FEEDING SUPPLEMENT, JEVITY 1.2 CAL,) LIQD Place 1,000 mLs into feeding tube continuous. 1000 mL 3  . pantoprazole sodium (PROTONIX) 40 mg/20 mL PACK Place 20 mLs (40 mg total)  into feeding tube 2 (two) times daily.    . polyethylene glycol (MIRALAX / GLYCOLAX) packet Place 17 g into feeding tube daily.     . ranitidine (ZANTAC) 150 MG tablet Take 150 mg by mouth 2 (two) times daily.    . sennosides (SENOKOT) 8.8 MG/5ML syrup Place 10 mLs into feeding tube at bedtime.    . shark liver oil-cocoa butter (PREPARATION H) 0.25-3-85.5 % suppository Place 1 suppository rectally every 12 (twelve) hours as needed (rectal bleeding).    . vancomycin IVPB Inject 1,000 mg into the vein daily for 24 days. Indication:  MRSA pneumonia Last Day of Therapy:  08/31/18 Labs - Every Monday:  CBC/D Labs - Every Monday and Thursday:  BMP and vancomycin  trough 25 Units 0  . Water For Irrigation, Sterile (FREE WATER) SOLN Place 180 mLs into feeding tube 6 (six) times daily. 1000 mL 0   No current facility-administered medications for this visit.     Allergies as of 08/12/2018 - Review Complete 08/05/2018  Allergen Reaction Noted  . Ciprofloxacin Swelling 07/03/2017  . Contrast media [iodinated diagnostic agents] Other (See Comments) 08/01/2018  . Penicillins Rash 07/03/2017    ROS:  General: Negative for anorexia, weight loss, fever, chills, fatigue, weakness. ENT: Negative for hoarseness, difficulty swallowing , nasal congestion. CV: Negative for chest pain, angina, palpitations, dyspnea on exertion, peripheral edema.  Respiratory: Negative for dyspnea at rest, dyspnea on exertion, cough, sputum, wheezing.  GI: See history of present illness. GU:  Negative for dysuria, hematuria, urinary incontinence, urinary frequency, nocturnal urination.  Endo: Negative for unusual weight change.    Physical Examination:   There were no vitals filed for this visit.  General: Somnolent.  Lethargic Eyes: No icterus. Conjunctivae pink. Mouth: Oropharyngeal mucosa moist and pink , no lesions erythema or exudate. Neck: Supple, Trachea midline Abdomen: Bowel sounds are normal, nontender, nondistended, no hepatosplenomegaly or masses, no abdominal bruits or hernia , no rebound or guarding.   Extremities: No lower extremity edema. No clubbing or deformities. Skin: Warm and dry, no jaundice.      Labs: CMP     Component Value Date/Time   NA 145 08/06/2018 0459   K 4.3 08/06/2018 0459   CL 110 08/06/2018 0459   CO2 31 08/06/2018 0459   GLUCOSE 88 08/06/2018 0459   BUN 28 (H) 08/06/2018 0459   CREATININE 0.88 08/06/2018 0459   CALCIUM 8.0 (L) 08/06/2018 0459   PROT 6.2 (L) 08/06/2018 0459   ALBUMIN 1.7 (L) 08/06/2018 0459   AST 18 08/06/2018 0459   ALT 12 08/06/2018 0459   ALKPHOS 63 08/06/2018 0459   BILITOT 0.6 08/06/2018 0459    GFRNONAA >60 08/06/2018 0459   GFRAA >60 08/06/2018 0459   Lab Results  Component Value Date   WBC 12.5 (H) 08/06/2018   HGB 8.6 (L) 08/06/2018   HCT 26.9 (L) 08/06/2018   MCV 83.9 08/06/2018   PLT 733 (H) 08/06/2018    Imaging Studies: Dg Abd 1 View  Result Date: 08/04/2018 CLINICAL DATA:  Gastrostomy tube retightening.  Assess placement. EXAM: ABDOMEN - 1 VIEW COMPARISON:  07/21/2018 FINDINGS: Gastrostomy tube over the stomach in the left upper quadrant. Contrast is seen filling the tube and stomach without extravasation. Remainder the exam is unchanged. IMPRESSION: Gastrostomy tube in adequate position over the stomach in the left upper quadrant. No extravasation. Electronically Signed   By: Elberta Fortis M.D.   On: 08/04/2018 14:20   Dg Abdomen 1 View  Result  Date: 07/21/2018 CLINICAL DATA:  G-tube displacement EXAM: ABDOMEN - 1 VIEW COMPARISON:  07/21/2018 at 0220 hours FINDINGS: 30 mL contrast administered via indwelling gastrostomy tube. Contrast opacifies the stomach and proximal duodenum, confirming intragastric placement. IMPRESSION: Intragastric gastrostomy placement confirmed. Electronically Signed   By: Charline BillsSriyesh  Krishnan M.D.   On: 07/21/2018 20:19   Dg Abdomen 1 View  Result Date: 07/21/2018 CLINICAL DATA:  Evaluate PEG placement. EXAM: ABDOMEN - 1 VIEW COMPARISON:  None. FINDINGS: AP view of the abdomen obtained after installation of enteric contrast through indwelling gastrostomy tube (amount and type of contrast not specified). Contrast opacifies gastrostomy tubing in the stomach. No evidence of extravasation or leak. Large colonic stool burden without bowel obstruction. IVC filter in place. IMPRESSION: Enteric tube opacifying the stomach consistent with intraluminal PEG placement, no extravasation or leak. Electronically Signed   By: Rubye OaksMelanie  Ehinger M.D.   On: 07/21/2018 03:19   Ct Chest Wo Contrast  Result Date: 08/06/2018 CLINICAL DATA:  Bilateral pneumonia with acute  hypoxic respiratory failure and sepsis. EXAM: CT CHEST WITHOUT CONTRAST TECHNIQUE: Multidetector CT imaging of the chest was performed following the standard protocol without IV contrast. COMPARISON:  Chest x-rays dated 08/03/2018 and 08/01/2018 FINDINGS: Cardiovascular: Heart size is normal. Ascending thoracic aorta is dilated to a diameter of approximately 3.9 cm. Coronary artery calcifications. No pericardial effusion. Pacemaker in place. Mediastinum/Nodes: Several small precarinal lymph nodes, most likely reactive. Benign-appearing calcifications in the right lobe of the thyroid gland. Esophagus and trachea appear normal. Lungs/Pleura: There is consolidation and compressive atelectasis of the entire left lower lobe. There is focal consolidation and compressive atelectasis in the posterior aspect of the right lower lobe. There are small patchy areas of infiltrate in the right upper lobe. There is a large loculated left pleural effusion and a moderate right pleural effusion. Ultrasound appearance on 08/05/2018 demonstrated extensive internal septations in the fluid collection on the left consistent with an empyema. Upper Abdomen: There is marked chronic hydronephrosis of the left kidney with severe thinning of the renal cortex. Multiple stones in the right kidney. IVC filter in place. Gastrostomy tube in place, incompletely visualized. Musculoskeletal: No acute bone abnormality. Cervicothoracic surgical fusion. Chronic accentuation of the thoracic kyphosis. The posterior elements of the entire thoracic spine are diffusely fused. IMPRESSION: 1. Bilateral lower lobe pneumonia, more extensive on the left than the right. Small patchy areas of pneumonia in the right upper lobe. 2. Large left empyema. 3. Moderate right pleural effusion which may also represent an empyema. 4. Chronic severe hydronephrosis of the left kidney. Electronically Signed   By: Francene BoyersJames  Maxwell M.D.   On: 08/06/2018 10:39   [place] Chest (pleural  Effusion)  Result Date: 08/05/2018 CLINICAL DATA:  57 year old quadriplegic male with a history of frequent pneumonia is an recurrent left-sided pleural effusions. He presents today for attempted thoracentesis. EXAM: CHEST ULTRASOUND COMPARISON:  Chest x-ray 08/03/2018 FINDINGS: The left chest was interrogated with ultrasound. There is a multiloculated and highly complex pleural effusion. The individual fluid pockets are very small in size. Given that the patient is currently clinically asymptomatic, thoracentesis was deferred. IMPRESSION: Highly complex/loculated left-sided pleural effusion without dominant fluid pocket. Thoracentesis would be highly unlikely to provide adequate drainage and was therefore deferred. Electronically Signed   By: Malachy MoanHeath  McCullough M.D.   On: 08/05/2018 16:22   Dg Chest Port 1 View  Result Date: 08/03/2018 CLINICAL DATA:  Chest pain.  Transient apnea EXAM: PORTABLE CHEST 1 VIEW COMPARISON:  August 01, 2018 FINDINGS:  There remains extensive loculated pleural effusion on the left. There is airspace consolidation throughout the left lower lobe. There is a small right pleural effusion with patchy infiltrate in the right lower lobe. Heart is mildly enlarged with pulmonary vascularity normal. Pacemaker leads are attached to the right atrium and right ventricle. No adenopathy. Postoperative changes again noted in the lower cervical region. IMPRESSION: Persistent left lower lobe consolidation. Fairly extensive loculated pleural effusion on the left. Smaller pleural effusion on the right with patchy infiltrate right base. Stable cardiac prominence. Electronically Signed   By: Bretta Bang III M.D.   On: 08/03/2018 17:10   Dg Chest Portable 1 View  Result Date: 08/01/2018 CLINICAL DATA:  Respiratory distress this morning with periods of apnea. EXAM: PORTABLE CHEST 1 VIEW COMPARISON:  07/03/2017 FINDINGS: Cardiac pacemaker. Shallow inspiration. Cardiac enlargement with pulmonary  vascular congestion. Large left pleural effusion. Probable small right pleural effusion. Infiltration or atelectasis in the left lung base. Mild perihilar infiltration or edema. No pneumothorax. Old right rib fractures. IMPRESSION: Cardiac enlargement with pulmonary vascular congestion. Bilateral pleural effusions, greater on the left. Infiltration or atelectasis in the left lung base. Perihilar edema. Electronically Signed   By: Burman Nieves M.D.   On: 08/01/2018 06:38   [place] Ekg Site Rite  Result Date: 08/06/2018 If Site Rite image not attached, placement could not be confirmed due to current cardiac rhythm.   Assessment and Plan:   Alexander Escobar is a 57 y.o. y/o male here for follow-up of anemia, with recent aspiration pneumonia and hospital admission  Patient very lethargic today We will send patient to ER for evaluation Family agrees with this plan EMS staff who transported him here is available to take patient to ER Follow-up in clinic post ER visit to discuss further work-up for chronic  Dr Melodie Bouillon

## 2018-08-12 NOTE — ED Notes (Signed)
Resumed care from felicia rn.  Pt sleeping.  Family with pt.  Sinus on monitor

## 2018-08-30 ENCOUNTER — Other Ambulatory Visit: Payer: Self-pay

## 2018-08-30 DIAGNOSIS — J15212 Pneumonia due to Methicillin resistant Staphylococcus aureus: Secondary | ICD-10-CM

## 2018-09-02 ENCOUNTER — Ambulatory Visit: Payer: Medicaid Other | Admitting: Cardiothoracic Surgery

## 2018-09-09 ENCOUNTER — Encounter: Payer: Self-pay | Admitting: Cardiothoracic Surgery

## 2018-09-09 ENCOUNTER — Ambulatory Visit (INDEPENDENT_AMBULATORY_CARE_PROVIDER_SITE_OTHER): Payer: Medicaid Other | Admitting: Cardiothoracic Surgery

## 2018-09-09 VITALS — BP 118/78 | HR 72 | Temp 97.8°F | Ht 69.0 in | Wt 148.0 lb

## 2018-09-09 DIAGNOSIS — J15212 Pneumonia due to Methicillin resistant Staphylococcus aureus: Secondary | ICD-10-CM

## 2018-09-09 DIAGNOSIS — N319 Neuromuscular dysfunction of bladder, unspecified: Secondary | ICD-10-CM | POA: Insufficient documentation

## 2018-09-09 NOTE — Patient Instructions (Signed)
Return as needed

## 2018-09-09 NOTE — Progress Notes (Signed)
  Patient ID: Alexander CofferJames Bartko, male   DOB: 11/28/1961, 57 y.o.   MRN: [id number]  HISTORY: Patient returns today in follow-up.  He did have a chest x-ray made last week.  He is accompanied today by his guardian who states that the x-ray only showed some congestion.  He denies any fevers or chills.  He finished his antibiotics and is scheduled to follow-up with his primary care physician later on for that.  He continues to have a PICC line in place.   Vitals:   09/09/18 1135  BP: 118/78  Pulse: 72  Temp: 97.8 F (36.6 C)     EXAM:    Resp: Lungs are clear bilaterally but with very poor inspiratory effort..  No respiratory distress, normal effort. Heart:  Regular without murmurs Abd:  Abdomen is soft, non distended and non tender. No masses are palpable.  There is no rebound and no guarding.    ASSESSMENT: I have independently reviewed the chest x-ray that he had made last week and brought to us with a disc.  There is marked improvement in the overall appearance of the lungs.   PLAN:   He will continue his follow-up with his primary care physician.  At the present time I did not see any need for any surgical intervention or surgical follow-up.    Hulda Marinimothy Nickol Collister, MD

## 2018-09-11 ENCOUNTER — Ambulatory Visit
Admission: RE | Admit: 2018-09-11 | Discharge: 2018-09-11 | Disposition: A | Discharge status: Home / Self Care | Payer: Self-pay | Source: Ambulatory Visit | Attending: Cardiothoracic Surgery | Admitting: Cardiothoracic Surgery

## 2018-09-11 ENCOUNTER — Other Ambulatory Visit: Payer: Self-pay | Admitting: Cardiothoracic Surgery

## 2018-09-11 DIAGNOSIS — J15212 Pneumonia due to Methicillin resistant Staphylococcus aureus: Secondary | ICD-10-CM

## 2018-09-17 ENCOUNTER — Emergency Department: Payer: Medicaid Other

## 2018-09-17 ENCOUNTER — Other Ambulatory Visit: Payer: Self-pay

## 2018-09-17 ENCOUNTER — Inpatient Hospital Stay
Admission: EM | Admit: 2018-09-17 | Discharge: 2018-09-20 | DRG: 871 | Disposition: A | Discharge status: Skilled Nursing Facility | Payer: Medicaid Other | Source: Skilled Nursing Facility | Attending: Internal Medicine | Admitting: Internal Medicine

## 2018-09-17 DIAGNOSIS — Y95 Nosocomial condition: Secondary | ICD-10-CM | POA: Diagnosis present

## 2018-09-17 DIAGNOSIS — T17890A Other foreign object in other parts of respiratory tract causing asphyxiation, initial encounter: Secondary | ICD-10-CM | POA: Diagnosis present

## 2018-09-17 DIAGNOSIS — L89029 Pressure ulcer of left elbow, unspecified stage: Secondary | ICD-10-CM | POA: Diagnosis present

## 2018-09-17 DIAGNOSIS — R011 Cardiac murmur, unspecified: Secondary | ICD-10-CM | POA: Diagnosis present

## 2018-09-17 DIAGNOSIS — J9811 Atelectasis: Secondary | ICD-10-CM | POA: Diagnosis present

## 2018-09-17 DIAGNOSIS — J45909 Unspecified asthma, uncomplicated: Secondary | ICD-10-CM | POA: Diagnosis present

## 2018-09-17 DIAGNOSIS — Z881 Allergy status to other antibiotic agents status: Secondary | ICD-10-CM

## 2018-09-17 DIAGNOSIS — J869 Pyothorax without fistula: Secondary | ICD-10-CM | POA: Diagnosis present

## 2018-09-17 DIAGNOSIS — A4102 Sepsis due to Methicillin resistant Staphylococcus aureus: Principal | ICD-10-CM | POA: Diagnosis present

## 2018-09-17 DIAGNOSIS — R609 Edema, unspecified: Secondary | ICD-10-CM

## 2018-09-17 DIAGNOSIS — N179 Acute kidney failure, unspecified: Secondary | ICD-10-CM | POA: Diagnosis present

## 2018-09-17 DIAGNOSIS — G8250 Quadriplegia, unspecified: Secondary | ICD-10-CM | POA: Diagnosis present

## 2018-09-17 DIAGNOSIS — Z7401 Bed confinement status: Secondary | ICD-10-CM

## 2018-09-17 DIAGNOSIS — Z66 Do not resuscitate: Secondary | ICD-10-CM | POA: Diagnosis present

## 2018-09-17 DIAGNOSIS — Z981 Arthrodesis status: Secondary | ICD-10-CM

## 2018-09-17 DIAGNOSIS — Z8249 Family history of ischemic heart disease and other diseases of the circulatory system: Secondary | ICD-10-CM

## 2018-09-17 DIAGNOSIS — Z931 Gastrostomy status: Secondary | ICD-10-CM

## 2018-09-17 DIAGNOSIS — J15212 Pneumonia due to Methicillin resistant Staphylococcus aureus: Secondary | ICD-10-CM | POA: Diagnosis present

## 2018-09-17 DIAGNOSIS — J9601 Acute respiratory failure with hypoxia: Secondary | ICD-10-CM | POA: Diagnosis present

## 2018-09-17 DIAGNOSIS — E43 Unspecified severe protein-calorie malnutrition: Secondary | ICD-10-CM | POA: Diagnosis present

## 2018-09-17 DIAGNOSIS — E872 Acidosis: Secondary | ICD-10-CM | POA: Diagnosis present

## 2018-09-17 DIAGNOSIS — M62838 Other muscle spasm: Secondary | ICD-10-CM | POA: Diagnosis present

## 2018-09-17 DIAGNOSIS — K219 Gastro-esophageal reflux disease without esophagitis: Secondary | ICD-10-CM | POA: Diagnosis present

## 2018-09-17 DIAGNOSIS — Z950 Presence of cardiac pacemaker: Secondary | ICD-10-CM

## 2018-09-17 DIAGNOSIS — R1310 Dysphagia, unspecified: Secondary | ICD-10-CM | POA: Diagnosis present

## 2018-09-17 DIAGNOSIS — Z6826 Body mass index (BMI) 26.0-26.9, adult: Secondary | ICD-10-CM | POA: Diagnosis not present

## 2018-09-17 DIAGNOSIS — F411 Generalized anxiety disorder: Secondary | ICD-10-CM | POA: Diagnosis present

## 2018-09-17 DIAGNOSIS — J189 Pneumonia, unspecified organism: Secondary | ICD-10-CM | POA: Diagnosis not present

## 2018-09-17 DIAGNOSIS — J151 Pneumonia due to Pseudomonas: Secondary | ICD-10-CM | POA: Diagnosis present

## 2018-09-17 DIAGNOSIS — Z880 Allergy status to penicillin: Secondary | ICD-10-CM

## 2018-09-17 DIAGNOSIS — R195 Other fecal abnormalities: Secondary | ICD-10-CM | POA: Diagnosis present

## 2018-09-17 DIAGNOSIS — F339 Major depressive disorder, recurrent, unspecified: Secondary | ICD-10-CM | POA: Diagnosis present

## 2018-09-17 DIAGNOSIS — J90 Pleural effusion, not elsewhere classified: Secondary | ICD-10-CM | POA: Diagnosis present

## 2018-09-17 DIAGNOSIS — K5909 Other constipation: Secondary | ICD-10-CM | POA: Diagnosis present

## 2018-09-17 DIAGNOSIS — Z8614 Personal history of Methicillin resistant Staphylococcus aureus infection: Secondary | ICD-10-CM | POA: Diagnosis not present

## 2018-09-17 DIAGNOSIS — Z515 Encounter for palliative care: Secondary | ICD-10-CM | POA: Diagnosis not present

## 2018-09-17 DIAGNOSIS — Z79891 Long term (current) use of opiate analgesic: Secondary | ICD-10-CM

## 2018-09-17 DIAGNOSIS — E440 Moderate protein-calorie malnutrition: Secondary | ICD-10-CM | POA: Diagnosis not present

## 2018-09-17 DIAGNOSIS — E860 Dehydration: Secondary | ICD-10-CM | POA: Diagnosis present

## 2018-09-17 DIAGNOSIS — Z91041 Radiographic dye allergy status: Secondary | ICD-10-CM

## 2018-09-17 DIAGNOSIS — X58XXXA Exposure to other specified factors, initial encounter: Secondary | ICD-10-CM | POA: Diagnosis present

## 2018-09-17 DIAGNOSIS — Z79890 Hormone replacement therapy (postmenopausal): Secondary | ICD-10-CM

## 2018-09-17 DIAGNOSIS — Z79899 Other long term (current) drug therapy: Secondary | ICD-10-CM

## 2018-09-17 DIAGNOSIS — J9809 Other diseases of bronchus, not elsewhere classified: Secondary | ICD-10-CM | POA: Diagnosis not present

## 2018-09-17 DIAGNOSIS — R6521 Severe sepsis with septic shock: Secondary | ICD-10-CM | POA: Diagnosis present

## 2018-09-17 DIAGNOSIS — D509 Iron deficiency anemia, unspecified: Secondary | ICD-10-CM | POA: Diagnosis present

## 2018-09-17 DIAGNOSIS — Z87891 Personal history of nicotine dependence: Secondary | ICD-10-CM

## 2018-09-17 DIAGNOSIS — A419 Sepsis, unspecified organism: Secondary | ICD-10-CM

## 2018-09-17 LAB — CBC WITH DIFFERENTIAL/PLATELET
Band Neutrophils: 3 %
Basophils Absolute: 0.2 10*3/uL — ABNORMAL HIGH (ref 0–0.1)
Basophils Relative: 1 %
Blasts: 0 %
EOS PCT: 0 %
Eosinophils Absolute: 0 10*3/uL (ref 0–0.7)
HCT: 22.3 % — ABNORMAL LOW (ref 40.0–52.0)
HEMOGLOBIN: 7.1 g/dL — AB (ref 13.0–18.0)
LYMPHS ABS: 0.2 10*3/uL — AB (ref 1.0–3.6)
LYMPHS PCT: 1 %
MCH: 26.7 pg (ref 26.0–34.0)
MCHC: 31.9 g/dL — ABNORMAL LOW (ref 32.0–36.0)
MCV: 83.8 fL (ref 80.0–100.0)
MYELOCYTES: 0 %
Metamyelocytes Relative: 0 %
Monocytes Absolute: 2.5 10*3/uL — ABNORMAL HIGH (ref 0.2–1.0)
Monocytes Relative: 10 %
NEUTROS PCT: 85 %
NRBC: 0 /100{WBCs}
Neutro Abs: 21.7 10*3/uL — ABNORMAL HIGH (ref 1.4–6.5)
OTHER: 0 %
PLATELETS: 512 10*3/uL — AB (ref 150–440)
Promyelocytes Relative: 0 %
RBC: 2.66 MIL/uL — AB (ref 4.40–5.90)
RDW: 19.5 % — ABNORMAL HIGH (ref 11.5–14.5)
WBC: 24.6 10*3/uL — ABNORMAL HIGH (ref 3.8–10.6)

## 2018-09-17 LAB — URINALYSIS, COMPLETE (UACMP) WITH MICROSCOPIC
BILIRUBIN URINE: NEGATIVE
Glucose, UA: NEGATIVE mg/dL
Hgb urine dipstick: NEGATIVE
KETONES UR: NEGATIVE mg/dL
Nitrite: POSITIVE — AB
Protein, ur: NEGATIVE mg/dL
Specific Gravity, Urine: 1.012 (ref 1.005–1.030)
pH: 5 (ref 5.0–8.0)

## 2018-09-17 LAB — COMPREHENSIVE METABOLIC PANEL
ALT: 20 U/L (ref 0–44)
ANION GAP: 13 (ref 5–15)
AST: 35 U/L (ref 15–41)
Albumin: 2.1 g/dL — ABNORMAL LOW (ref 3.5–5.0)
Alkaline Phosphatase: 85 U/L (ref 38–126)
BUN: 63 mg/dL — ABNORMAL HIGH (ref 6–20)
CALCIUM: 8.3 mg/dL — AB (ref 8.9–10.3)
CO2: 26 mmol/L (ref 22–32)
Chloride: 99 mmol/L (ref 98–111)
Creatinine, Ser: 1.43 mg/dL — ABNORMAL HIGH (ref 0.61–1.24)
GFR calc non Af Amer: 54 mL/min — ABNORMAL LOW (ref >60)
Glucose, Bld: 136 mg/dL — ABNORMAL HIGH (ref 70–99)
POTASSIUM: 4.5 mmol/L (ref 3.5–5.1)
SODIUM: 138 mmol/L (ref 135–145)
Total Bilirubin: 0.7 mg/dL (ref 0.3–1.2)
Total Protein: 7.4 g/dL (ref 6.5–8.1)

## 2018-09-17 LAB — TROPONIN I: Troponin I: <0.03 ng/mL (ref <0.03)

## 2018-09-17 LAB — EXPECTORATED SPUTUM ASSESSMENT W REFEX TO RESP CULTURE

## 2018-09-17 LAB — LACTIC ACID, PLASMA
LACTIC ACID, VENOUS: 3.9 mmol/L — AB (ref 0.5–1.9)
LACTIC ACID, VENOUS: 2.2 mmol/L — AB (ref 0.5–1.9)

## 2018-09-17 LAB — EXPECTORATED SPUTUM ASSESSMENT W GRAM STAIN, RFLX TO RESP C

## 2018-09-17 LAB — PROCALCITONIN: Procalcitonin: 0.75 ng/mL

## 2018-09-17 LAB — MRSA PCR SCREENING: MRSA by PCR: NEGATIVE

## 2018-09-17 MED ORDER — SENNOSIDES 8.8 MG/5ML PO SYRP
10.0000 mL | ORAL_SOLUTION | Freq: Every day | ORAL | Status: DC
  Filled 2018-09-17 (×4): qty 10

## 2018-09-17 MED ORDER — BACLOFEN 10 MG PO TABS
20.0000 mg | ORAL_TABLET | Freq: Every day | ORAL | Status: DC
  Administered 2018-09-17 – 2018-09-19 (×3): 20 mg via ORAL
  Filled 2018-09-17 (×4): qty 2

## 2018-09-17 MED ORDER — IPRATROPIUM-ALBUTEROL 0.5-2.5 (3) MG/3ML IN SOLN
3.0000 mL | RESPIRATORY_TRACT | Status: DC | PRN
  Administered 2018-09-19: 16:00:00 3 mL via RESPIRATORY_TRACT
  Filled 2018-09-17: qty 3

## 2018-09-17 MED ORDER — ONDANSETRON HCL 4 MG/2ML IJ SOLN
4.0000 mg | Freq: Four times a day (QID) | INTRAMUSCULAR | Status: DC | PRN
  Administered 2018-09-19: 15:00:00 4 mg via INTRAVENOUS
  Filled 2018-09-17: qty 2

## 2018-09-17 MED ORDER — FLUOXETINE HCL 20 MG/5ML PO SOLN
20.0000 mg | Freq: Every day | ORAL | Status: DC
  Administered 2018-09-18 – 2018-09-20 (×3): 20 mg
  Filled 2018-09-17 (×4): qty 5

## 2018-09-17 MED ORDER — ADULT MULTIVITAMIN LIQUID CH
15.0000 mL | Freq: Every day | ORAL | Status: DC
  Administered 2018-09-18 – 2018-09-20 (×3): 15 mL
  Filled 2018-09-17 (×5): qty 15

## 2018-09-17 MED ORDER — VANCOMYCIN HCL IN DEXTROSE 1-5 GM/200ML-% IV SOLN: 1000.0000 mg | INTRAVENOUS | Status: DC

## 2018-09-17 MED ORDER — BACLOFEN 10 MG PO TABS
10.0000 mg | ORAL_TABLET | Freq: Every day | ORAL | Status: DC | PRN
  Filled 2018-09-17: qty 1

## 2018-09-17 MED ORDER — SODIUM CHLORIDE 0.9 % IV BOLUS
1000.0000 mL | Freq: Once | INTRAVENOUS | Status: AC
  Administered 2018-09-17: 1000 mL via INTRAVENOUS

## 2018-09-17 MED ORDER — PANTOPRAZOLE SODIUM 40 MG PO PACK
40.0000 mg | PACK | Freq: Two times a day (BID) | ORAL | Status: DC
  Administered 2018-09-17 – 2018-09-20 (×6): 40 mg
  Filled 2018-09-17 (×6): qty 20

## 2018-09-17 MED ORDER — HYDROCODONE-ACETAMINOPHEN 10-325 MG PO TABS
1.0000 | ORAL_TABLET | Freq: Four times a day (QID) | ORAL | Status: DC
  Administered 2018-09-17 – 2018-09-20 (×12): 1
  Filled 2018-09-17 (×12): qty 1

## 2018-09-17 MED ORDER — ACETAMINOPHEN 650 MG RE SUPP: 650.0000 mg | Freq: Four times a day (QID) | RECTAL | Status: DC | PRN

## 2018-09-17 MED ORDER — CETIRIZINE HCL 10 MG PO TABS
10.0000 mg | ORAL_TABLET | Freq: Every day | ORAL | Status: DC
  Administered 2018-09-18 – 2018-09-19 (×3): 10 mg
  Filled 2018-09-17 (×4): qty 1

## 2018-09-17 MED ORDER — MELATONIN 5 MG PO TABS
5.0000 mg | ORAL_TABLET | Freq: Every day | ORAL | Status: DC
  Administered 2018-09-17 – 2018-09-18 (×2): 5 mg
  Filled 2018-09-17 (×4): qty 1

## 2018-09-17 MED ORDER — BACLOFEN 10 MG PO TABS
10.0000 mg | ORAL_TABLET | Freq: Every morning | ORAL | Status: DC
  Administered 2018-09-18 – 2018-09-20 (×3): 10 mg
  Filled 2018-09-17 (×3): qty 1

## 2018-09-17 MED ORDER — SODIUM CHLORIDE 0.9 % IV SOLN
2.0000 g | Freq: Once | INTRAVENOUS | Status: AC
  Administered 2018-09-17: 2 g via INTRAVENOUS
  Filled 2018-09-17: qty 2

## 2018-09-17 MED ORDER — BISACODYL 10 MG RE SUPP
10.0000 mg | Freq: Every day | RECTAL | Status: DC
  Administered 2018-09-17: 22:00:00 10 mg via RECTAL
  Filled 2018-09-17 (×4): qty 1

## 2018-09-17 MED ORDER — OSTEO BI-FLEX ADV JOINT SHIELD PO TABS: ORAL_TABLET | Freq: Every day | ORAL | Status: DC

## 2018-09-17 MED ORDER — FUROSEMIDE 20 MG PO TABS
20.0000 mg | ORAL_TABLET | Freq: Every day | ORAL | Status: DC
  Administered 2018-09-18: 09:00:00 20 mg
  Filled 2018-09-17: qty 1

## 2018-09-17 MED ORDER — PRO-STAT SUGAR FREE PO LIQD
30.0000 mL | Freq: Three times a day (TID) | ORAL | Status: DC
  Administered 2018-09-17 – 2018-09-18 (×2): 30 mL

## 2018-09-17 MED ORDER — ACETAMINOPHEN 325 MG PO TABS
650.0000 mg | ORAL_TABLET | Freq: Four times a day (QID) | ORAL | Status: DC | PRN
  Administered 2018-09-17 – 2018-09-19 (×3): 650 mg
  Filled 2018-09-17 (×3): qty 2

## 2018-09-17 MED ORDER — FAMOTIDINE 20 MG PO TABS
20.0000 mg | ORAL_TABLET | Freq: Two times a day (BID) | ORAL | Status: DC
  Administered 2018-09-17 – 2018-09-20 (×6): 20 mg
  Filled 2018-09-17 (×6): qty 1

## 2018-09-17 MED ORDER — SODIUM CHLORIDE 0.9 % IV SOLN
INTRAVENOUS | Status: DC
  Administered 2018-09-17 – 2018-09-19 (×4): via INTRAVENOUS

## 2018-09-17 MED ORDER — DOCUSATE SODIUM 50 MG/5ML PO LIQD
200.0000 mg | Freq: Every day | ORAL | Status: DC
  Filled 2018-09-17 (×4): qty 20

## 2018-09-17 MED ORDER — FERROUS SULFATE 220 (44 FE) MG/5ML PO ELIX
325.0000 mg | ORAL_SOLUTION | Freq: Three times a day (TID) | ORAL | Status: DC
  Administered 2018-09-18 – 2018-09-20 (×8): 325 mg
  Filled 2018-09-17 (×9): qty 7.4

## 2018-09-17 MED ORDER — POLYETHYLENE GLYCOL 3350 17 G PO PACK
17.0000 g | PACK | Freq: Every day | ORAL | Status: DC
  Administered 2018-09-20: 10:00:00 17 g
  Filled 2018-09-17 (×3): qty 1

## 2018-09-17 MED ORDER — ACETAMINOPHEN 325 MG PO TABS: 650.0000 mg | ORAL_TABLET | Freq: Four times a day (QID) | ORAL | Status: DC | PRN

## 2018-09-17 MED ORDER — VITAMIN C 500 MG PO TABS
500.0000 mg | ORAL_TABLET | Freq: Every day | ORAL | Status: DC
  Administered 2018-09-18 – 2018-09-20 (×3): 500 mg
  Filled 2018-09-17 (×3): qty 1

## 2018-09-17 MED ORDER — DOXEPIN HCL 10 MG PO CAPS
10.0000 mg | ORAL_CAPSULE | Freq: Every day | ORAL | Status: DC
  Administered 2018-09-17 – 2018-09-19 (×3): 10 mg
  Filled 2018-09-17 (×4): qty 1

## 2018-09-17 MED ORDER — ONDANSETRON HCL 4 MG PO TABS: 4.0000 mg | ORAL_TABLET | Freq: Four times a day (QID) | ORAL | Status: DC | PRN

## 2018-09-17 MED ORDER — ENOXAPARIN SODIUM 40 MG/0.4ML ~~LOC~~ SOLN: 40.0000 mg | SUBCUTANEOUS | Status: DC

## 2018-09-17 MED ORDER — RISAQUAD PO CAPS
1.0000 | ORAL_CAPSULE | Freq: Every day | ORAL | Status: DC
  Administered 2018-09-18 – 2018-09-20 (×3): 1 via ORAL
  Filled 2018-09-17 (×3): qty 1

## 2018-09-17 MED ORDER — SODIUM CHLORIDE 0.9 % IV SOLN: 2.0000 g | Freq: Two times a day (BID) | INTRAVENOUS | Status: DC

## 2018-09-17 MED ORDER — METOCLOPRAMIDE HCL 5 MG/ML IJ SOLN
10.0000 mg | Freq: Once | INTRAMUSCULAR | Status: AC
  Administered 2018-09-17: 10 mg via INTRAVENOUS
  Filled 2018-09-17: qty 2

## 2018-09-17 MED ORDER — VANCOMYCIN HCL IN DEXTROSE 1-5 GM/200ML-% IV SOLN
1000.0000 mg | Freq: Once | INTRAVENOUS | Status: AC
  Administered 2018-09-17: 1000 mg via INTRAVENOUS
  Filled 2018-09-17: qty 200

## 2018-09-17 MED ORDER — ALPRAZOLAM 0.5 MG PO TABS
0.5000 mg | ORAL_TABLET | Freq: Every day | ORAL | Status: DC
  Administered 2018-09-17 – 2018-09-19 (×3): 0.5 mg
  Filled 2018-09-17 (×3): qty 1

## 2018-09-17 MED ORDER — FLUTICASONE PROPIONATE 50 MCG/ACT NA SUSP
2.0000 | Freq: Every day | NASAL | Status: DC
  Administered 2018-09-18 – 2018-09-20 (×3): 2 via NASAL
  Filled 2018-09-17: qty 16

## 2018-09-17 MED ORDER — MAGNESIUM HYDROXIDE 400 MG/5ML PO SUSP
30.0000 mL | Freq: Two times a day (BID) | ORAL | Status: DC
  Filled 2018-09-17 (×7): qty 30

## 2018-09-17 MED ORDER — FENTANYL CITRATE (PF) 100 MCG/2ML IJ SOLN
50.0000 ug | INTRAMUSCULAR | Status: DC | PRN
  Administered 2018-09-17: 50 ug via INTRAVENOUS
  Filled 2018-09-17: qty 2

## 2018-09-17 NOTE — ED Notes (Signed)
Date and time results received: 09/17/18 1518   Test: lactic acid Critical Value: 2.2  Name of Provider Notified: Dr. Allena Katz

## 2018-09-17 NOTE — Progress Notes (Signed)
PHARMACIST - PHYSICIAN ORDER COMMUNICATION ° °CONCERNING: P&T Medication Policy on Herbal Medications ° °DESCRIPTION:  This patient’s order for:  Osteo-Bi Flex  has been noted. ° °This product(s) is classified as an “herbal” or natural product. °Due to a lack of definitive safety studies or FDA approval, nonstandard manufacturing practices, plus the potential risk of unknown drug-drug interactions while on inpatient medications, the Pharmacy and Therapeutics Committee does not permit the use of “herbal” or natural products of this type within [facility]. °  °ACTION TAKEN: °The pharmacy department is unable to verify this order at this time and your patient has been informed of this safety policy. °Please reevaluate patient’s clinical condition at discharge and address if the herbal or natural product(s) should be resumed at that time. ° °
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
## 2018-09-17 NOTE — ED Notes (Signed)
Date and time results received: 09/17/18 1212   Test: lactic acid Critical Value: 3.9  Name of Provider Notified: Dr. Roxan Hockey
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
## 2018-09-17 NOTE — ED Notes (Signed)
Caregiver given supplies to change dressing on pt bottom. Changing dressing to pt G tube as well at this time.

## 2018-09-17 NOTE — ED Notes (Signed)
Pts peg tube flushed with water at feeding supplement administration
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
## 2018-09-17 NOTE — ED Notes (Signed)
Pts peg tube flushed with water at med administration.

## 2018-09-17 NOTE — ED Notes (Signed)
Pt given warm blanket. Given lemon swabs for dry mouth and lip moisturizer. Caregiver remains at bedside.

## 2018-09-17 NOTE — ED Notes (Signed)
Attempted to give report. Will call back in few minutes to try again.

## 2018-09-17 NOTE — Progress Notes (Signed)
Advanced care plan.  Purpose of the Encounter: CODE STATUS  Parties in Attendance:patient and his POA  Patient's Decision Capacity: Intact  Subjective/Patient's story:  Patient is 57 y/o with quadriplegia now being admitted for sepsis  Objective/Medical story  I discussed with the patient regarding his desires for cardiac and pulmonary resuscitation. Apparently patient was DNR during previous admission now him and his healthcare power of attorney wants him to be a full code   Goals of care determination: Full code    CODE STATUS: Full code   Time spent discussing advanced care planning: 16 minutes

## 2018-09-17 NOTE — ED Notes (Signed)
Dr Robinson at bedside 

## 2018-09-17 NOTE — Consult Note (Addendum)
Pharmacy Antibiotic Note  Alexander Escobar is a 57 y.o. male admitted on 09/17/2018 with pneumonia.  Pharmacy has been consulted for vancomycin and cefepime dosing.  Plan: Vancomycin 1000 mg IV once followed by 6 hour stacked dosing of vancomycin 1000 mg IV every 18 hours.  Goal trough 15-20 mcg/mL.  Trough to be drawn 30 minutes prior to the fifth dose.  Cefepime 2 gm IV every 12 hours  Height: 5\' 7"  (170.2 cm) Weight: 148 lb (67.1 kg) IBW/kg (Calculated) : 66.1  Temp (24hrs), Avg:99.6 F (37.6 C), Min:99.6 F (37.6 C), Max:99.6 F (37.6 C)  Recent Labs  Lab 09/17/18 1124  WBC 24.6*  CREATININE 1.43*  LATICACIDVEN 3.9*    Estimated Creatinine Clearance: 54.6 mL/min (A) (by C-G formula based on SCr of 1.43 mg/dL (H)).    Allergies  Allergen Reactions  . Ciprofloxacin Swelling  . Contrast Media [Iodinated Diagnostic Agents] Other (See Comments)    Constipation   . Penicillins Rash    Has patient had a PCN reaction causing immediate rash, facial/tongue/throat swelling, SOB or lightheadedness with hypotension: Unknown Has patient had a PCN reaction causing severe rash involving mucus membranes or skin necrosis: Unknown Has patient had a PCN reaction that required hospitalization: Unknown Has patient had a PCN reaction occurring within the last 10 years: Unknown If all of the above answers are "NO", then may proceed with Cephalosporin use.     Antimicrobials this admission: Cefepime 10/01 >>  Vanco 10/01 >>   Dose adjustments this admission:   Microbiology results: 10/01 BCx:   UCx:   10/01 Sputum:   10/01 MRSA PCR:   Thank you for allowing pharmacy to be a part of this patient's care.  Orinda Kenner 09/17/2018 12:28 PM

## 2018-09-17 NOTE — Progress Notes (Signed)
CODE SEPSIS - PHARMACY COMMUNICATION  **Broad Spectrum Antibiotics should be administered within 1 hour of Sepsis diagnosis**  Time Code Sepsis Called/Page Received: 1646  Antibiotics Ordered: Cefepime and vancomycin  Time of 1st antibiotic administration: 1209  Additional action taken by pharmacy: n/a  If necessary, Name of Provider/Nurse Contacted: n/a    Orinda Kenner ,PharmD Clinical Pharmacist  09/17/2018  3:47 PM

## 2018-09-17 NOTE — ED Notes (Signed)
Pt taken to xray at this time.

## 2018-09-18 ENCOUNTER — Other Ambulatory Visit: Payer: Self-pay

## 2018-09-18 LAB — IRON AND TIBC
IRON: 11 ug/dL — AB (ref 45–182)
SATURATION RATIOS: 5 % — AB (ref 17.9–39.5)
TIBC: 214 ug/dL — AB (ref 250–450)
UIBC: 203 ug/dL

## 2018-09-18 LAB — CBC
HEMATOCRIT: 20 % — AB (ref 40.0–52.0)
HEMOGLOBIN: 6.4 g/dL — AB (ref 13.0–18.0)
MCH: 26.5 pg (ref 26.0–34.0)
MCHC: 32 g/dL (ref 32.0–36.0)
MCV: 82.9 fL (ref 80.0–100.0)
Platelets: 422 10*3/uL (ref 150–440)
RBC: 2.42 MIL/uL — AB (ref 4.40–5.90)
RDW: 19.2 % — ABNORMAL HIGH (ref 11.5–14.5)
WBC: 20.3 10*3/uL — ABNORMAL HIGH (ref 3.8–10.6)

## 2018-09-18 LAB — BASIC METABOLIC PANEL
ANION GAP: 6 (ref 5–15)
BUN: 56 mg/dL — ABNORMAL HIGH (ref 6–20)
CHLORIDE: 104 mmol/L (ref 98–111)
CO2: 28 mmol/L (ref 22–32)
CREATININE: 1.31 mg/dL — AB (ref 0.61–1.24)
Calcium: 7.9 mg/dL — ABNORMAL LOW (ref 8.9–10.3)
GFR calc non Af Amer: 60 mL/min — ABNORMAL LOW (ref >60)
Glucose, Bld: 102 mg/dL — ABNORMAL HIGH (ref 70–99)
POTASSIUM: 4.2 mmol/L (ref 3.5–5.1)
SODIUM: 138 mmol/L (ref 135–145)

## 2018-09-18 LAB — FOLATE: FOLATE: 40 ng/mL (ref >5.9)

## 2018-09-18 LAB — VANCOMYCIN, RANDOM: VANCOMYCIN RM: 9

## 2018-09-18 LAB — VITAMIN B12: Vitamin B-12: 397 pg/mL (ref 180–914)

## 2018-09-18 LAB — FERRITIN: Ferritin: 56 ng/mL (ref 24–336)

## 2018-09-18 LAB — RETICULOCYTES
RBC.: 2.46 MIL/uL — ABNORMAL LOW (ref 4.40–5.90)
Retic Count, Absolute: 59 10*3/uL (ref 19.0–183.0)
Retic Ct Pct: 2.4 % (ref 0.4–3.1)

## 2018-09-18 LAB — PREPARE RBC (CROSSMATCH)

## 2018-09-18 MED ORDER — INFLUENZA VAC SPLIT QUAD 0.5 ML IM SUSY
0.5000 mL | PREFILLED_SYRINGE | INTRAMUSCULAR | Status: DC
  Filled 2018-09-18: qty 0.5

## 2018-09-18 MED ORDER — VANCOMYCIN VARIABLE DOSE PER UNSTABLE RENAL FUNCTION (PHARMACIST DOSING): Status: DC

## 2018-09-18 MED ORDER — SODIUM CHLORIDE 0.9% IV SOLUTION
Freq: Once | INTRAVENOUS | Status: AC
  Administered 2018-09-18: 16:00:00 via INTRAVENOUS

## 2018-09-18 MED ORDER — SODIUM CHLORIDE 0.9 % IV SOLN
200.0000 mg | INTRAVENOUS | Status: AC
  Administered 2018-09-18 – 2018-09-20 (×3): 200 mg via INTRAVENOUS
  Filled 2018-09-18 (×3): qty 10

## 2018-09-18 MED ORDER — JUVEN PO PACK
1.0000 | PACK | Freq: Two times a day (BID) | ORAL | Status: DC
  Administered 2018-09-18 – 2018-09-20 (×2): 1

## 2018-09-18 MED ORDER — JEVITY 1.5 CAL/FIBER PO LIQD
350.0000 mL | Freq: Four times a day (QID) | ORAL | Status: DC
  Administered 2018-09-18 (×2): 237 mL
  Administered 2018-09-18 – 2018-09-20 (×7): 350 mL

## 2018-09-18 MED ORDER — FREE WATER
200.0000 mL | Freq: Four times a day (QID) | Status: DC
  Administered 2018-09-18 – 2018-09-20 (×9): 200 mL

## 2018-09-18 MED ORDER — PRO-STAT SUGAR FREE PO LIQD
30.0000 mL | Freq: Every day | ORAL | Status: DC
  Administered 2018-09-19 – 2018-09-20 (×2): 30 mL

## 2018-09-18 MED ORDER — SODIUM CHLORIDE 0.9 % IV SOLN
2.0000 g | Freq: Two times a day (BID) | INTRAVENOUS | Status: DC
  Administered 2018-09-18 – 2018-09-20 (×5): 2 g via INTRAVENOUS
  Filled 2018-09-18 (×8): qty 2

## 2018-09-18 MED ORDER — VANCOMYCIN HCL IN DEXTROSE 1-5 GM/200ML-% IV SOLN
1000.0000 mg | Freq: Once | INTRAVENOUS | Status: AC
  Administered 2018-09-18: 17:00:00 1000 mg via INTRAVENOUS
  Filled 2018-09-18: qty 200
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
## 2018-09-18 NOTE — Progress Notes (Signed)
OT Cancellation Note  Patient Details Name: Alexander Escobar MRN: [id number] DOB: Dec 10, 1961   Cancelled Treatment:    Reason Eval/Treat Not Completed: Medical issues which prohibited therapy. Order received, chart reviewed. Pt noted with low Hgb 6.4 and receiving blood transfusion. Pt inappropriate for OT evaluation this date. Will re-attempt at later date/time as pt is medically appropriate.   Richrd Prime, MPH, MS, OTR/L ascom [phone] 09/18/18, 5:07 PM

## 2018-09-18 NOTE — Consult Note (Signed)
WOC Nurse wound consult note Reason for Consult:Healing Stage 4 pressure injury. Patient known to our department.  Last Seen by my associate, K. Sanders on 08/02/18.  Seen previously by this writer on 07/07/18. Followed by [facility] department. Wound type:Pressure Pressure Injury POA: Yes Measurement: 1cm x 0.8cnm x 0.1cm Wound bed:red, moist Drainage (amount, consistency, odor) Scant serous  Periwound:intact Dressing procedure/placement/frequency: Orders continued from [facility]:  Aquacel Ag+ once daily.  Turn and reposition using wedge from home.  Float heels.  WOC nursing team will not follow, but will remain available to this patient, the nursing and medical teams.  Please re-consult if needed. Thanks, Ladona Mow, MSN, RN, GNP, [place]  Pager# [phone]
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
## 2018-09-18 NOTE — Progress Notes (Signed)
Pt requested to be quad coughed. Quad coughed pt for moderate amount of yellowish green thick secretions. Pt remains on 4L [place] 98%, HR 84, RR 18. Pt tol process well.

## 2018-09-18 NOTE — Consult Note (Signed)
Pharmacy Antibiotic Note  Alexander Escobar is a 57 y.o. male admitted on 09/17/2018 with pneumonia.  Pharmacy has been consulted for vancomycin and cefepime dosing. Vancomycin d/c'd by MD. Vancomycin restarted for fever and staph aureus in sputum (pt with hx of MRSA PNA).   Pt is a quadriplegic and usual PK calculations not accurate. Upon chart review, pt received vancomycin 1 g IV q24h from 8/16-8/21 with a trough of 20 mcg/ml on 8/19 (before 4th dose?). SCr at that time was 0.92. Scr today is 1.31 improved from 1.43. Pt likely essentially with AKI.   Vancomycin 1g IV x1 given in ED 10/1 at 1210.  Plan: Based on the above information, will check a random vancomycin level today before continuing to dose to ensure that pt is clearing doses. Vd ~47 L.  Random level ~24h after yesterday's dose = 9 mcg/ml Will order vancomycin 1 g x1 for today BMET already ordered for AM - will follow up SCr/renal function in AM and reassess vancomycin dose. Entered placeholder order to indicate active vancomycin dosing.     Resume Cefepime 2 g IV every 12 hours     Height: 5\' 7"  (170.2 cm) Weight: 148 lb (67.1 kg) IBW/kg (Calculated) : 66.1  Temp (24hrs), Avg:99.5 F (37.5 C), Min:98.4 F (36.9 C), Max:100.8 F (38.2 C)  Recent Labs  Lab 09/17/18 1124 09/17/18 1422 09/18/18 0644 09/18/18 1309  WBC 24.6*  --  20.3*  --   CREATININE 1.43*  --  1.31*  --   LATICACIDVEN 3.9* 2.2*  --   --   VANCORANDOM  --   --   --  9    Estimated Creatinine Clearance: 59.6 mL/min (A) (by C-G formula based on SCr of 1.31 mg/dL (H)).    Allergies  Allergen Reactions  . Ciprofloxacin Swelling  . Contrast Media [Iodinated Diagnostic Agents] Other (See Comments)    Constipation   . Penicillins Rash    Has patient had a PCN reaction causing immediate rash, facial/tongue/throat swelling, SOB or lightheadedness with hypotension: Unknown Has patient had a PCN reaction causing severe rash involving mucus membranes or  skin necrosis: Unknown Has patient had a PCN reaction that required hospitalization: Unknown Has patient had a PCN reaction occurring within the last 10 years: Unknown If all of the above answers are "NO", then may proceed with Cephalosporin use.     Antimicrobials this admission: Cefepime 10/01 >> 10/1, 10/2>> Vanco 10/01 >> 10/1  Dose adjustments this admission:   Microbiology results: 10/01 BCx: NGTD 10/01 Sputum: mod staph aureus, susceptibilities to follow 10/01 MRSA PCR: neg  Thank you for allowing pharmacy to be a part of this patient's care.  Marty Heck 09/18/2018 3:28 PM
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
## 2018-09-18 NOTE — Clinical Social Work Note (Signed)
Clinical Social Work Assessment  Patient Details  Name: Alexander Escobar MRN: [id number] Date of Birth: 1961/06/13  Date of referral:  09/18/18               Reason for consult:  Facility Placement                Permission sought to share information with:  Case Manager, Customer service manager, Family Supports Permission granted to share information::  Yes, Verbal Permission Granted  Name::        Agency::     Relationship::     Contact Information:     Housing/Transportation Living arrangements for the past 2 months:  [place] of Information:  Patient Patient Interpreter Needed:  None Criminal Activity/Legal Involvement Pertinent to Current Situation/Hospitalization:  No - Comment as needed Significant Relationships:  Other Family Members Lives with:  Facility Resident Do you feel safe going back to the place where you live?  Yes Need for family participation in patient care:  Yes (Comment)  Care giving concerns:  Patient is a long term resident at [facility] Worker assessment / plan:  CSW consulted for facility placement. CSW met with patient to discuss discharge plan. CSW introduced self and explained role. Patient states that he lives at [facility] and has been there about 16-18 months. Patient states that he would like to return to [facility]. CSW spoke with Claiborne Billings, admissions coordinator at [facility] states that patient is a long term resident and can return when ready. CSW will follow for discharge planning.   Employment status:  Disabled (Comment on whether or not currently receiving Disability) Insurance information:  Medicaid [place] PT Recommendations:  Not assessed at this time Information / Referral to community resources:     Patient/Family's Response to care:  Patient thanked CSW for assistance   Patient/Family's Understanding of and Emotional Response to Diagnosis, Current  Treatment, and Prognosis:  Patient is in agreement with discharge plan   Emotional Assessment Appearance:  Appears stated age Attitude/Demeanor/Rapport:    Affect (typically observed):  Accepting, Pleasant, Appropriate Orientation:  Oriented to Self, Oriented to Place, Oriented to  Time Alcohol / Substance use:  Not Applicable Psych involvement (Current and /or in the community):  No (Comment)  Discharge Needs  Concerns to be addressed:  Discharge Planning Concerns Readmission within the last 30 days:  No Current discharge risk:  None Barriers to Discharge:  Continued Medical Work up   [employer], [place] 09/18/2018, 10:27 AM
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
## 2018-09-18 NOTE — Consult Note (Addendum)
Pharmacy Antibiotic Note  Alexander Escobar is a 57 y.o. male admitted on 09/17/2018 with pneumonia.  Pharmacy has been consulted for vancomycin and cefepime dosing. Vancomycin d/c'd by MD. Vancomycin restarted for fever and staph aureus in sputum (pt with hx of MRSA PNA).   Pt is a quadriplegic and usual PK calculations not accurate. Upon chart review, pt received vancomycin 1 g IV q24h from 8/16-8/21 with a trough of 20 mcg/ml on 8/19 (before 4th dose?). SCr at that time was 0.92. Scr today is 1.31 improved from 1.43. Pt likely essentially with AKI.   Vancomycin 1g IV x1 given in ED 10/1 at 1210.  Plan: Based on the above information, will check a random vancomycin level today before continuing to dose to ensure that pt is clearing doses. Vd ~47 L.  Resume Cefepime 2 g IV every 12 hours     Height: 5\' 7"  (170.2 cm) Weight: 148 lb (67.1 kg) IBW/kg (Calculated) : 66.1  Temp (24hrs), Avg:99.2 F (37.3 C), Min:98.4 F (36.9 C), Max:100.8 F (38.2 C)  Recent Labs  Lab 09/17/18 1124 09/17/18 1422 09/18/18 0644  WBC 24.6*  --  20.3*  CREATININE 1.43*  --  1.31*  LATICACIDVEN 3.9* 2.2*  --     Estimated Creatinine Clearance: 59.6 mL/min (A) (by C-G formula based on SCr of 1.31 mg/dL (H)).    Allergies  Allergen Reactions  . Ciprofloxacin Swelling  . Contrast Media [Iodinated Diagnostic Agents] Other (See Comments)    Constipation   . Penicillins Rash    Has patient had a PCN reaction causing immediate rash, facial/tongue/throat swelling, SOB or lightheadedness with hypotension: Unknown Has patient had a PCN reaction causing severe rash involving mucus membranes or skin necrosis: Unknown Has patient had a PCN reaction that required hospitalization: Unknown Has patient had a PCN reaction occurring within the last 10 years: Unknown If all of the above answers are "NO", then may proceed with Cephalosporin use.     Antimicrobials this admission: Cefepime 10/01 >> 10/1,  10/2>> Vanco 10/01 >> 10/1  Dose adjustments this admission:   Microbiology results: 10/01 BCx: NGTD 10/01 Sputum: mod staph aureus, susceptibilities to follow 10/01 MRSA PCR: neg  Thank you for allowing pharmacy to be a part of this patient's care.  Marty Heck 09/18/2018 9:58 AM

## 2018-09-19 ENCOUNTER — Inpatient Hospital Stay: Payer: Medicaid Other

## 2018-09-19 DIAGNOSIS — E440 Moderate protein-calorie malnutrition: Secondary | ICD-10-CM

## 2018-09-19 DIAGNOSIS — J9809 Other diseases of bronchus, not elsewhere classified: Secondary | ICD-10-CM

## 2018-09-19 DIAGNOSIS — J189 Pneumonia, unspecified organism: Secondary | ICD-10-CM

## 2018-09-19 LAB — BPAM RBC
Blood Product Expiration Date: 201910072359
Blood Product Expiration Date: 201910222359
ISSUE DATE / TIME: 201910021544
ISSUE DATE / TIME: 201910022150
Unit Type and Rh: 600
Unit Type and Rh: 6200

## 2018-09-19 LAB — BASIC METABOLIC PANEL
ANION GAP: 5 (ref 5–15)
BUN: 57 mg/dL — ABNORMAL HIGH (ref 6–20)
CO2: 28 mmol/L (ref 22–32)
Calcium: 7.9 mg/dL — ABNORMAL LOW (ref 8.9–10.3)
Chloride: 106 mmol/L (ref 98–111)
Creatinine, Ser: 1.12 mg/dL (ref 0.61–1.24)
Glucose, Bld: 98 mg/dL (ref 70–99)
POTASSIUM: 4.5 mmol/L (ref 3.5–5.1)
SODIUM: 139 mmol/L (ref 135–145)

## 2018-09-19 LAB — TYPE AND SCREEN
ABO/RH(D): A POS
Antibody Screen: NEGATIVE
UNIT DIVISION: 0
Unit division: 0

## 2018-09-19 LAB — CBC
HCT: 25.8 % — ABNORMAL LOW (ref 40.0–52.0)
Hemoglobin: 8.5 g/dL — ABNORMAL LOW (ref 13.0–18.0)
MCH: 27.1 pg (ref 26.0–34.0)
MCHC: 32.8 g/dL (ref 32.0–36.0)
MCV: 82.5 fL (ref 80.0–100.0)
PLATELETS: 448 10*3/uL — AB (ref 150–440)
RBC: 3.13 MIL/uL — ABNORMAL LOW (ref 4.40–5.90)
RDW: 17.2 % — ABNORMAL HIGH (ref 11.5–14.5)
WBC: 17.3 10*3/uL — AB (ref 3.8–10.6)

## 2018-09-19 MED ORDER — SALINE SPRAY 0.65 % NA SOLN
1.0000 | NASAL | Status: DC | PRN
  Administered 2018-09-20: 1 via NASAL
  Filled 2018-09-19: qty 44

## 2018-09-19 MED ORDER — VANCOMYCIN HCL IN DEXTROSE 1-5 GM/200ML-% IV SOLN
1000.0000 mg | INTRAVENOUS | Status: DC
  Administered 2018-09-19: 17:00:00 1000 mg via INTRAVENOUS
  Filled 2018-09-19 (×2): qty 200

## 2018-09-19 MED ORDER — OXYMETAZOLINE HCL 0.05 % NA SOLN
1.0000 | Freq: Two times a day (BID) | NASAL | Status: DC
  Administered 2018-09-19 – 2018-09-20 (×3): 1 via NASAL
  Filled 2018-09-19: qty 15

## 2018-09-19 MED ORDER — IPRATROPIUM-ALBUTEROL 0.5-2.5 (3) MG/3ML IN SOLN
3.0000 mL | RESPIRATORY_TRACT | Status: DC
  Administered 2018-09-19: 3 mL via RESPIRATORY_TRACT
  Filled 2018-09-19: qty 3

## 2018-09-19 MED ORDER — DIPHENHYDRAMINE HCL 12.5 MG/5ML PO ELIX
25.0000 mg | ORAL_SOLUTION | Freq: Once | ORAL | Status: AC
  Administered 2018-09-19: 19:00:00 25 mg
  Filled 2018-09-19: qty 10

## 2018-09-19 NOTE — Progress Notes (Signed)
OT Cancellation Note  Patient Details Name: Selestino Nila MRN: [id number] DOB: 1960-12-21   Cancelled Treatment:    Reason Eval/Treat Not Completed: Patient declined, no reason specified. Order received, chart reviewed. Met with pt. Pt noting "breathing issues." Just received Afrin per pt/RN. Also noted pt with pulmonology consult pending. Pt requesting hold OT evaluation at this time. OT suggested re-attempt at later date/time as medically appropriate and pt agreeable.  Jeni Salles, MPH, MS, OTR/L ascom [phone] 09/19/18, 2:43 PM

## 2018-09-19 NOTE — Progress Notes (Signed)
Patient ID: Alexander Escobar, male   DOB: [birth year], 57 y.o.   MRN: [id number]  Late Entry Patient seen on 10.2.19  Alexander Escobar is well known to me.  He is a young man of 56 years with quadriplegia and a history of recurrent pneumonia.  He was in our facility several weeks ago with a left lower lobe pneumonia and pleural effusion.  He was managed medically and I saw him last week in the office in followup.  At that time his chest xray was almost normal save for a small infiltrate in the left lower lobe.  He remained on antibiotics at that time which he subsequently stopped.    He was readmitted to the hospital again with recurrent pneumonia.  Had bouts of fever and chills at the nursing facility but no cough or sputum production.  On exam, he appears his baseline.  He is awake and alert.  Quite appropriate.   His lungs have some coarse breath sounds bilaterally with a poor inspiratory excursion.  His heart is regular and his abdomen was soft and nontender.  I have independently reviewed his chest xray.  There are bilateral infiltrates without obvious pleural effusion.  A/P Recurrent pneumonia currently on Vancomycin.   I do not see any evidence of lung abscess or empyema on the chest xray and therefore no obvious surgical intervention is required.  I would continue his medical care as you are and obtain a CT of the chest if clinically indicated.  [employer].

## 2018-09-19 NOTE — Progress Notes (Signed)
Per Dr. Nemiah Commander, ok for patient to wait until morning to have US doppler of right upper extremity.  Also gave verbal order for patient to have one time dose of benadryl at patient's request.  Orson Ape, RN, BSN
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
## 2018-09-19 NOTE — Consult Note (Signed)
Pharmacy Antibiotic Note  Alexander Escobar is a 57 y.o. male admitted on 09/17/2018 with pneumonia.  Pharmacy has been consulted for vancomycin and cefepime dosing. Vancomycin d/c'd by MD. Vancomycin restarted for fever and staph aureus in sputum (pt with hx of MRSA PNA).   Pt is a quadriplegic and usual PK calculations not accurate. Upon chart review, pt received vancomycin 1 g IV q24h from 8/16-8/21 with a trough of 20 mcg/ml on 8/19 (before 4th dose?). SCr at that time was 0.92. Scr today is 1.12 improved from 1.31.    Vancomycin 1g IV x1 given in ED 10/1 at 1210 and Vancomycin 1g x 1 10/2 @1600   Plan: Vd ~47 L.  Will continue with vancomycin 1 g  Every 24 hours. Will draw trough prior to 4th dose.   BMET already ordered for AM - will follow up SCr/renal function in AM and reassess vancomycin dose.  Continue Cefepime 2 g IV every 12 hours   Height: 5\' 7"  (170.2 cm) Weight: 148 lb (67.1 kg) IBW/kg (Calculated) : 66.1  Temp (24hrs), Avg:98.9 F (37.2 C), Min:98.3 F (36.8 C), Max:100.5 F (38.1 C)  Recent Labs  Lab 09/17/18 1124 09/17/18 1422 09/18/18 0644 09/18/18 1309 09/19/18 0311  WBC 24.6*  --  20.3*  --  17.3*  CREATININE 1.43*  --  1.31*  --  1.12  LATICACIDVEN 3.9* 2.2*  --   --   --   VANCORANDOM  --   --   --  9  --     Estimated Creatinine Clearance: 69.7 mL/min (by C-G formula based on SCr of 1.12 mg/dL).    Allergies  Allergen Reactions  . Ciprofloxacin Swelling  . Contrast Media [Iodinated Diagnostic Agents] Other (See Comments)    Constipation   . Penicillins Rash    Has patient had a PCN reaction causing immediate rash, facial/tongue/throat swelling, SOB or lightheadedness with hypotension: Unknown Has patient had a PCN reaction causing severe rash involving mucus membranes or skin necrosis: Unknown Has patient had a PCN reaction that required hospitalization: Unknown Has patient had a PCN reaction occurring within the last 10 years: Unknown If all of  the above answers are "NO", then may proceed with Cephalosporin use.     Antimicrobials this admission: Cefepime 10/01 >> 10/1, 10/2>> Vanco 10/01 >>   Dose adjustments this admission:   Microbiology results: 10/01 BCx: NGTD 10/01 Sputum: mod staph aureus, susceptibilities to follow 10/01 MRSA PCR: neg  Thank you for allowing pharmacy to be a part of this patient's care.  Gardner Candle, PharmD, BCPS Clinical Pharmacist 09/19/2018 8:44 AM
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
## 2018-09-20 ENCOUNTER — Encounter: Payer: Self-pay | Admitting: Hospice and Palliative Medicine

## 2018-09-20 ENCOUNTER — Inpatient Hospital Stay: Payer: Medicaid Other

## 2018-09-20 DIAGNOSIS — J9601 Acute respiratory failure with hypoxia: Secondary | ICD-10-CM

## 2018-09-20 DIAGNOSIS — Z515 Encounter for palliative care: Secondary | ICD-10-CM

## 2018-09-20 LAB — BASIC METABOLIC PANEL
ANION GAP: 6 (ref 5–15)
BUN: 43 mg/dL — AB (ref 6–20)
CALCIUM: 8 mg/dL — AB (ref 8.9–10.3)
CO2: 27 mmol/L (ref 22–32)
CREATININE: 0.96 mg/dL (ref 0.61–1.24)
Chloride: 106 mmol/L (ref 98–111)
GFR calc Af Amer: >60 mL/min (ref >60)
Glucose, Bld: 91 mg/dL (ref 70–99)
Potassium: 4.1 mmol/L (ref 3.5–5.1)
Sodium: 139 mmol/L (ref 135–145)

## 2018-09-20 LAB — CBC
HCT: 28.6 % — ABNORMAL LOW (ref 40.0–52.0)
Hemoglobin: 9.3 g/dL — ABNORMAL LOW (ref 13.0–18.0)
MCH: 26.8 pg (ref 26.0–34.0)
MCHC: 32.5 g/dL (ref 32.0–36.0)
MCV: 82.6 fL (ref 80.0–100.0)
PLATELETS: 475 10*3/uL — AB (ref 150–440)
RBC: 3.47 MIL/uL — ABNORMAL LOW (ref 4.40–5.90)
RDW: 18 % — AB (ref 11.5–14.5)
WBC: 14.1 10*3/uL — AB (ref 3.8–10.6)

## 2018-09-20 LAB — URINE CULTURE

## 2018-09-20 MED ORDER — ALPRAZOLAM 0.5 MG PO TABS: 0.5000 mg | ORAL_TABLET | Freq: Three times a day (TID) | ORAL | 0 refills | Status: AC | PRN

## 2018-09-20 MED ORDER — HYDROCODONE-ACETAMINOPHEN 10-325 MG PO TABS: 1.0000 | ORAL_TABLET | Freq: Four times a day (QID) | ORAL | 0 refills | Status: AC | PRN

## 2018-09-20 MED ORDER — DIPHENHYDRAMINE HCL 12.5 MG/5ML PO ELIX: 25.0000 mg | ORAL_SOLUTION | Freq: Three times a day (TID) | ORAL | 0 refills | Status: AC | PRN

## 2018-09-20 MED ORDER — SODIUM CHLORIDE 0.9 % IV SOLN
2.0000 g | Freq: Three times a day (TID) | INTRAVENOUS | Status: DC
  Filled 2018-09-20 (×3): qty 2

## 2018-09-20 MED ORDER — MORPHINE SULFATE (CONCENTRATE) 20 MG/ML PO SOLN: 10.0000 mg | ORAL | 0 refills | Status: AC | PRN

## 2018-09-20 MED ORDER — IPRATROPIUM-ALBUTEROL 0.5-2.5 (3) MG/3ML IN SOLN: 3.0000 mL | RESPIRATORY_TRACT | Status: DC | PRN

## 2018-09-20 MED ORDER — DIPHENHYDRAMINE HCL 12.5 MG/5ML PO ELIX
25.0000 mg | ORAL_SOLUTION | Freq: Once | ORAL | Status: AC
  Administered 2018-09-20: 25 mg
  Filled 2018-09-20: qty 10

## 2018-09-20 MED ORDER — ALBUTEROL SULFATE (2.5 MG/3ML) 0.083% IN NEBU
2.5000 mg | INHALATION_SOLUTION | RESPIRATORY_TRACT | Status: DC
  Administered 2018-09-20 (×2): 2.5 mg via RESPIRATORY_TRACT
  Filled 2018-09-20 (×2): qty 3

## 2018-09-20 NOTE — Progress Notes (Signed)
Pharmacy - Brief Note  Day #4 vancomycin and cefepime for MRSA pneumonia and also pseudomonas identified in 10/1 sputum culture  Plan:  Increase cefepime to 2gm IV q8h for psedomonas aeruginosa  Await susceptiblities  Juliette Alcide, PharmD, BCPS.   Work Cell: [phone] 09/20/2018 11:49 AM

## 2018-09-20 NOTE — Progress Notes (Signed)
Called and gave report to Jackie Plum RN at [facility]
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
## 2018-09-20 NOTE — Consult Note (Signed)
Pharmacy Antibiotic Note  Alexander Escobar is a 57 y.o. male admitted on 09/17/2018 with pneumonia.  Pharmacy has been consulted for vancomycin and cefepime dosing.   Pt is a quadriplegic and usual PK calculations not accurate. Upon chart review, pt received vancomycin 1 g IV q24h from 8/16-8/21 with a trough of 20 mcg/ml on 8/19 (before 4th dose?). SCr at that time was 0.92.  Vancomycin 1g IV x1 given in ED 10/1 at 1210 and Vancomycin 1g x 1 10/2 @1600   Plan: Vd ~47 L.  Will continue with vancomycin 1 g  Every 24 hours. Will draw trough prior to 4th dose.   Cefepime 2 g IV every 8 hours for psedomonas aeruginosa. Susceptibilities pending.    Height: 5\' 7"  (170.2 cm) Weight: 171 lb 11.2 oz (77.9 kg) IBW/kg (Calculated) : 66.1  Temp (24hrs), Avg:98.5 F (36.9 C), Min:97.7 F (36.5 C), Max:99.3 F (37.4 C)  Recent Labs  Lab 09/17/18 1124 09/17/18 1422 09/18/18 0644 09/18/18 1309 09/19/18 0311 09/20/18 0959  WBC 24.6*  --  20.3*  --  17.3* 14.1*  CREATININE 1.43*  --  1.31*  --  1.12 0.96  LATICACIDVEN 3.9* 2.2*  --   --   --   --   VANCORANDOM  --   --   --  9  --   --     Estimated Creatinine Clearance: 81.3 mL/min (by C-G formula based on SCr of 0.96 mg/dL).    Allergies  Allergen Reactions  . Ciprofloxacin Swelling  . Contrast Media [Iodinated Diagnostic Agents] Other (See Comments)    Constipation   . Penicillins Rash    Has patient had a PCN reaction causing immediate rash, facial/tongue/throat swelling, SOB or lightheadedness with hypotension: Unknown Has patient had a PCN reaction causing severe rash involving mucus membranes or skin necrosis: Unknown Has patient had a PCN reaction that required hospitalization: Unknown Has patient had a PCN reaction occurring within the last 10 years: Unknown If all of the above answers are "NO", then may proceed with Cephalosporin use.     Antimicrobials this admission: Cefepime 10/01 >> 10/1, 10/2>> Vanco 10/01 >>   Dose  adjustments this admission:   Microbiology results: 10/01 BCx: NGTD 10/01 Sputum: mod staph aureus, susceptibilities to follow 10/01 MRSA PCR: neg  Thank you for allowing pharmacy to be a part of this patient's care.  Gardner Candle, PharmD, BCPS Clinical Pharmacist 09/20/2018 11:59 AM

## 2018-09-20 NOTE — Progress Notes (Signed)
OT Cancellation Note  Patient Details Name: Alexander Escobar MRN: [id number] DOB: 11-27-1961   Cancelled Treatment:    Reason Eval/Treat Not Completed: Patient not medically ready.  Order received and chart reviewed.  Talked to [place] from [facility] who indicated that patient is going to move to Palliative care and transfer back to [facility] and will not need any further OT services.  Please re-consult if there is a change in plan and OT is needed.  Order completed. Thank you for the referral.   Susanne Borders, OTR/L ascom [phone] 09/20/18, 12:02 PM

## 2018-09-20 NOTE — Clinical Social Work Note (Addendum)
Patient to be d/c'ed today to [facility] room 43A.  Patient and family agreeable to plans will transport via ems RN to call report, CSW left a message on Milderd Meager voice mail, [phone] and there was another family member at bedside and is aware of patient's discharge.  Windell Moulding, MSW, Theresia Majors [phone]
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
## 2018-09-20 NOTE — Progress Notes (Signed)
Patient's family requested that I come by to speak with patient again about his prognosis. Patient asked me "how long do I have." I told him that prognostication can be challenging but given the probable resurgence of his sepsis, life expectancy would likely be measured in days to weeks. Family asked about if hospice could perform the "quad cough" mechanism, which is essentially manual pressure to the abdomen to help a patient expectorate. I called the hospice medical director and discussed the case.   All questions answered.

## 2018-09-20 NOTE — Clinical Social Work Note (Signed)
CSW received consult that patient and family would like to have hospice follow him at [facility].  CSW contacted Hospice and Palliative nurse liasion Diannia Ruder, [phone] to make referral.  CSW to continue to facilitate discharge planning.  Alexander Escobar. Mackynzie Woolford, MSW, Theresia Majors [phone]  09/20/2018 3:16 PM

## 2018-09-21 LAB — CULTURE, RESPIRATORY W GRAM STAIN

## 2018-09-21 LAB — CULTURE, RESPIRATORY

## 2018-09-22 LAB — CULTURE, BLOOD (ROUTINE X 2)
CULTURE: NO GROWTH
Culture: NO GROWTH

## 2018-10-18 DEATH — deceased

## 2019-09-04 IMAGING — DX DG CHEST 1V PORT
2 series · 2 of 2 positions shown · non-contrast
Comparison: August 01, 2018

CLINICAL DATA: Chest pain.  Transient apnea

EXAM:
PORTABLE CHEST 1 VIEW

[chest ap (1 of 2)]
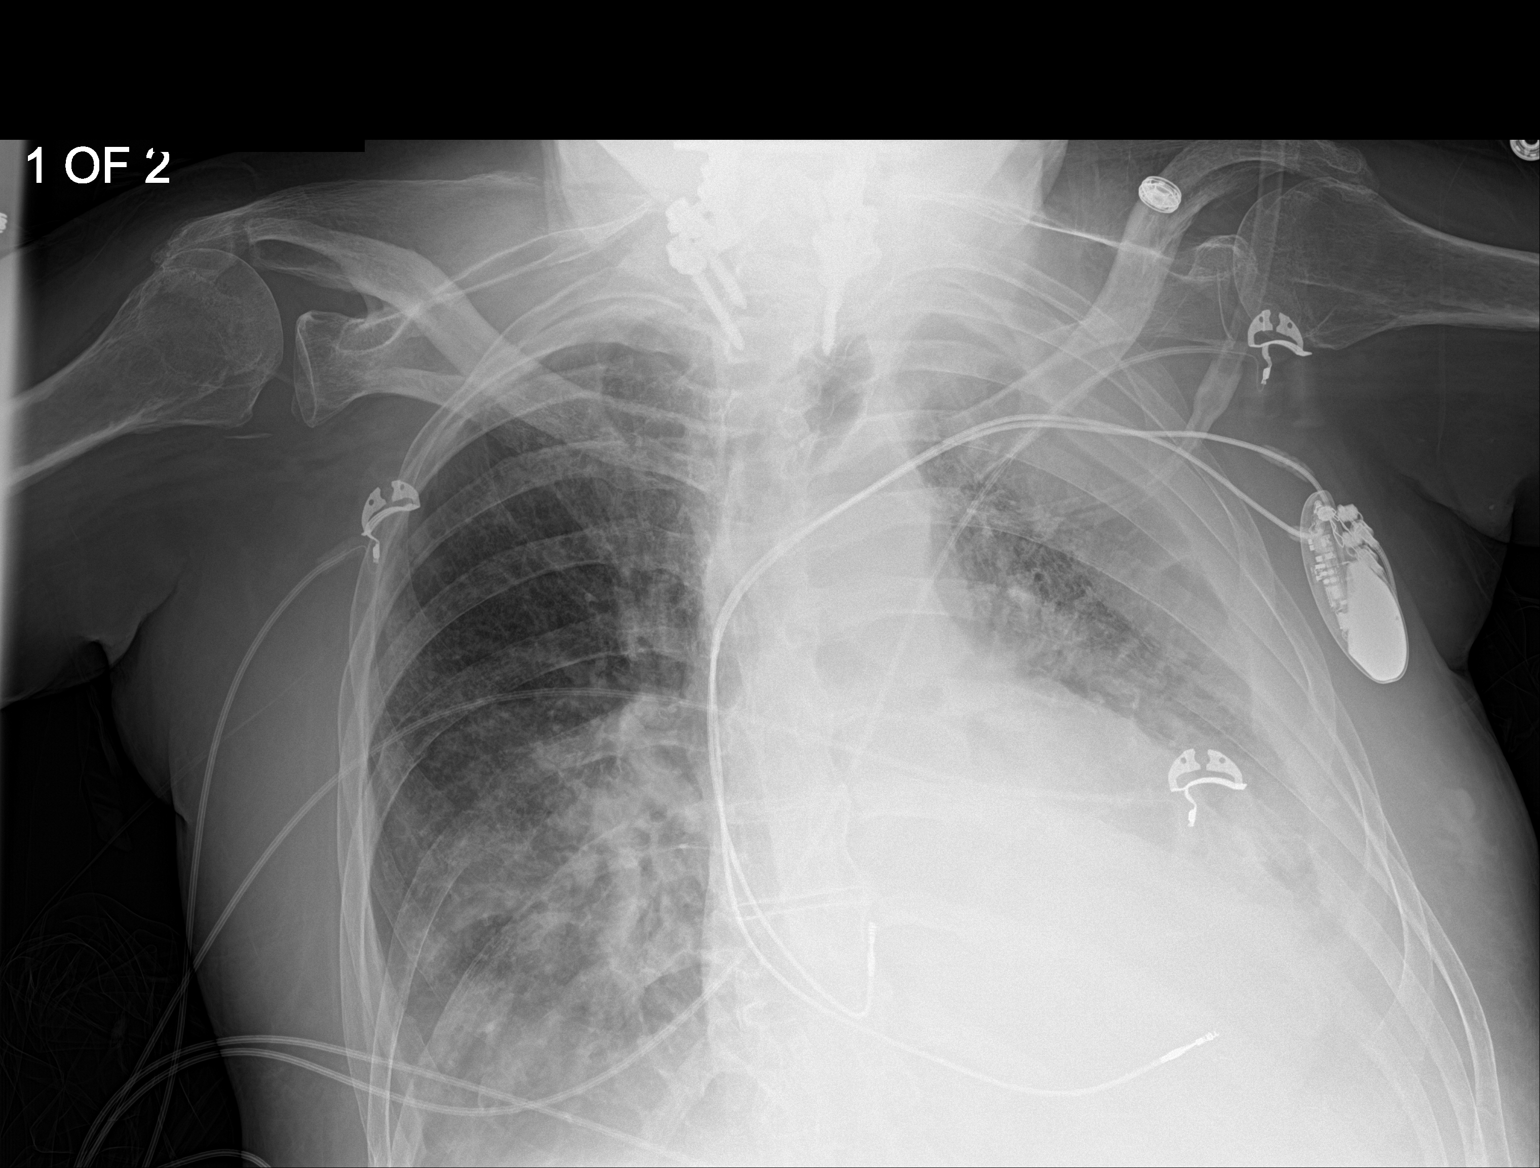

[chest ap (2 of 2)]
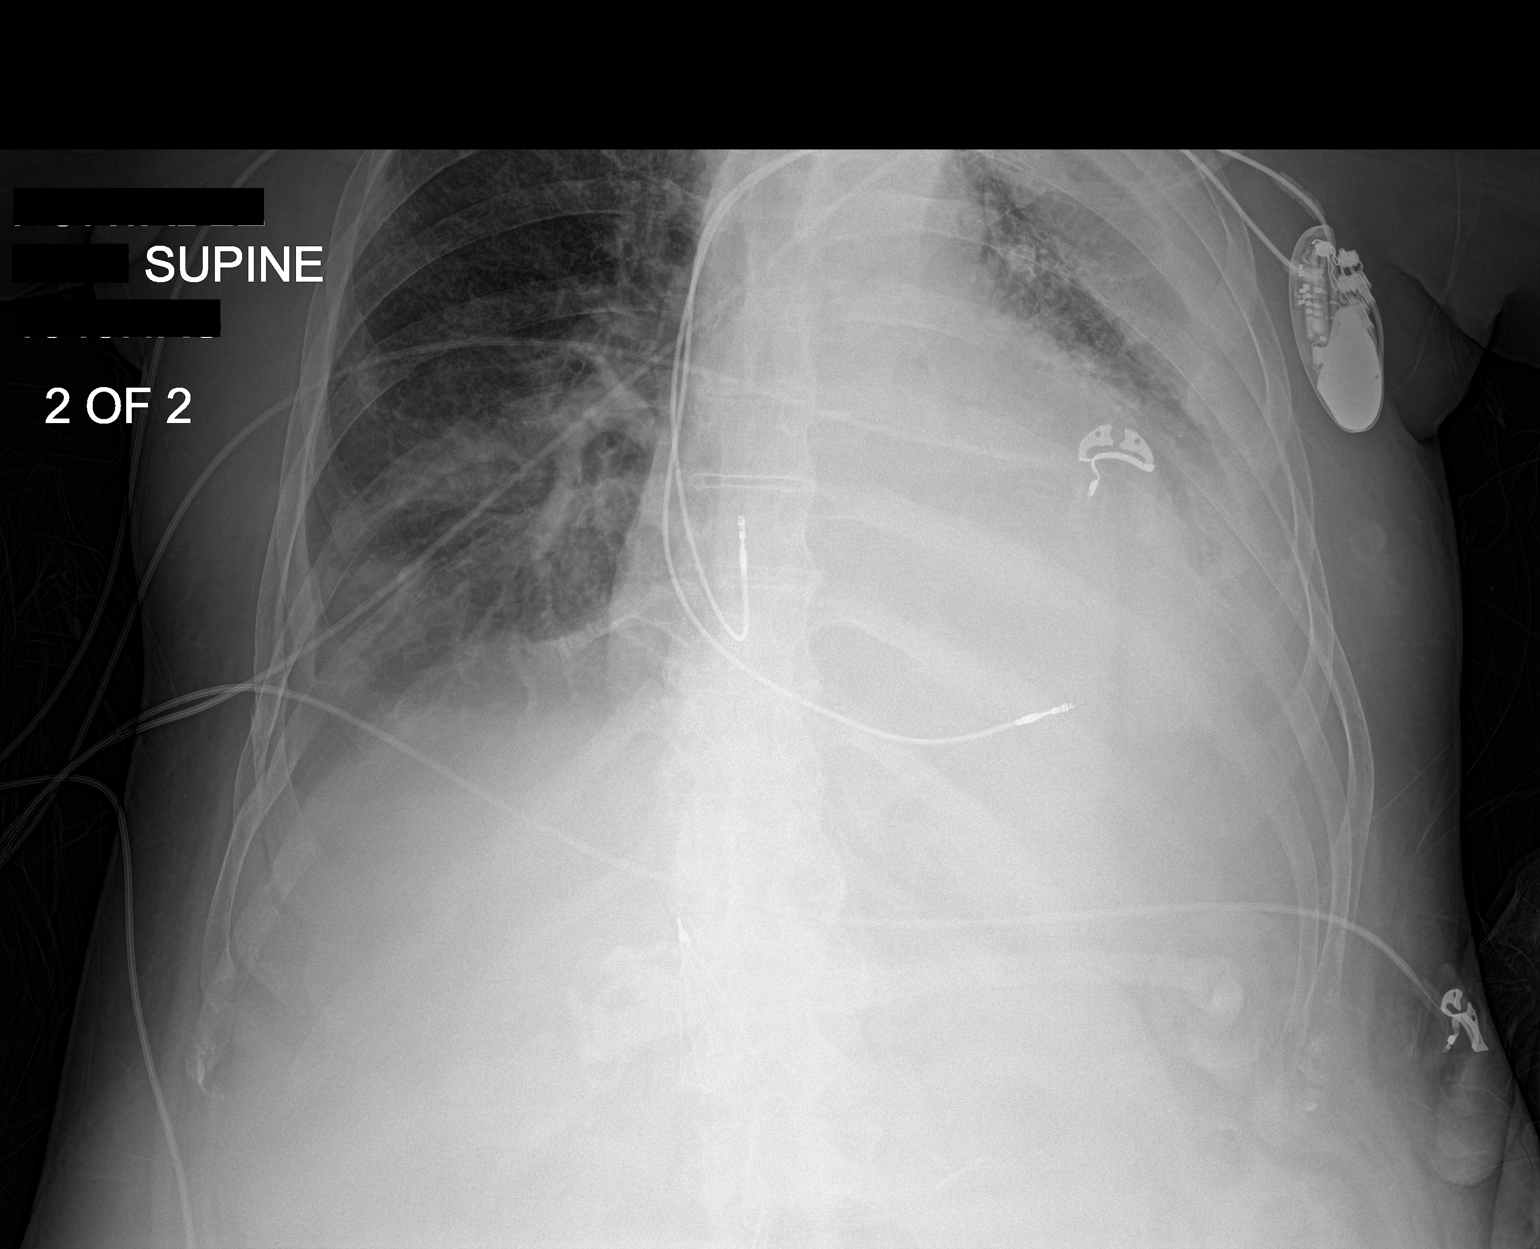

[2 of 2 positions shown; findings below may reference images not displayed]

FINDINGS: There remains extensive loculated pleural effusion on the left.
There is airspace consolidation throughout the left lower lobe.
There is a small right pleural effusion with patchy infiltrate in
the right lower lobe.

Heart is mildly enlarged with pulmonary vascularity normal.
Pacemaker leads are attached to the right atrium and right
ventricle. No adenopathy. Postoperative changes again noted in the
lower cervical region.
IMPRESSION: Persistent left lower lobe consolidation. Fairly extensive loculated
pleural effusion on the left. Smaller pleural effusion on the right
with patchy infiltrate right base. Stable cardiac prominence.

## 2019-10-21 IMAGING — CT CT CHEST W/O CM
2 of 4 series · 15 of 36 positions shown, 18 images · non-contrast
Comparison: Chest radiograph 09/17/2018, 08/06/2018

CLINICAL DATA: Empyema.  Recurrent pneumonia.

EXAM:
CT CHEST WITHOUT CONTRAST
TECHNIQUE: Multidetector CT imaging of the chest was performed following the
standard protocol without IV contrast.

[Series 2: thorax (person_name) · axial · 0.68mm/px · z∈[+194,+438]mm · 12 of 144 slices shown, 15 images]
[im 11/144  mediastinal]
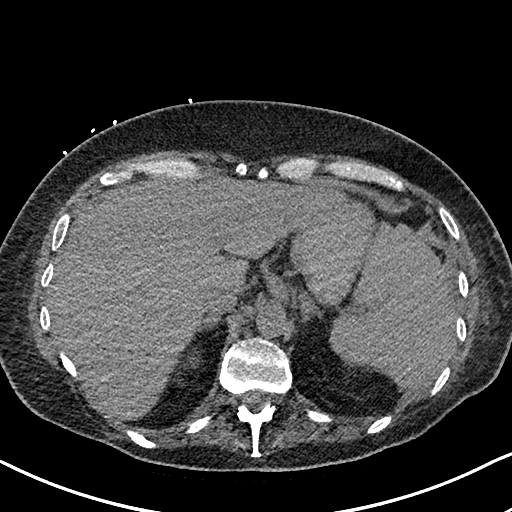
[im 11/144  lung]
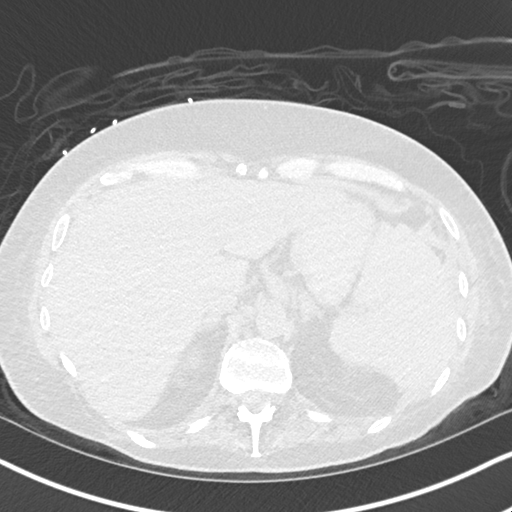
[im 21/144  lung]
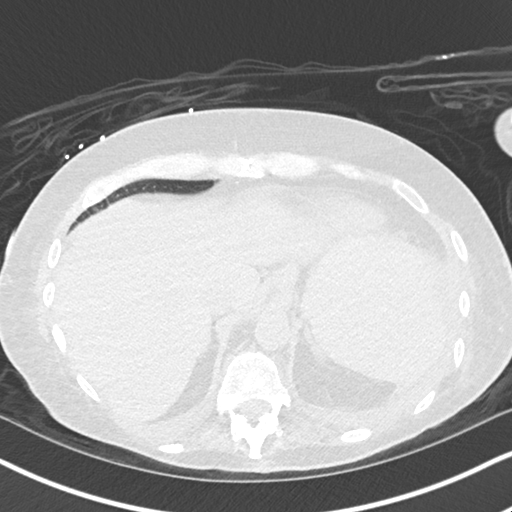
[im 31/144  lung]
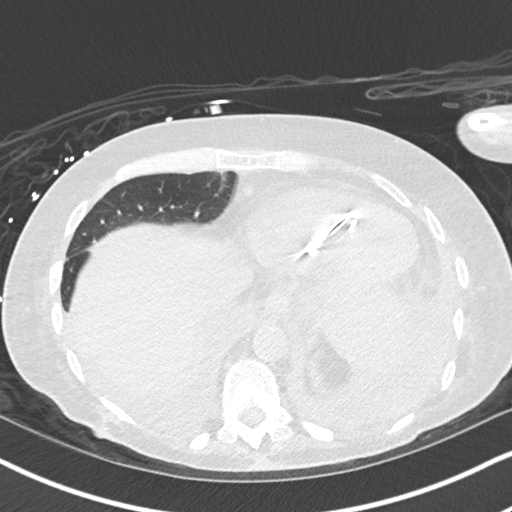
[im 41/144  lung]
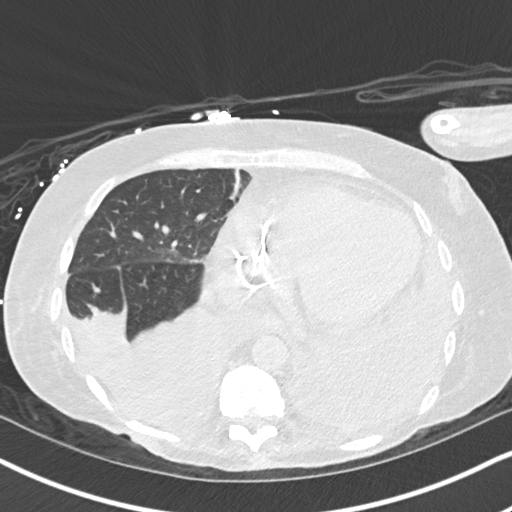
[im 52/144  mediastinal]
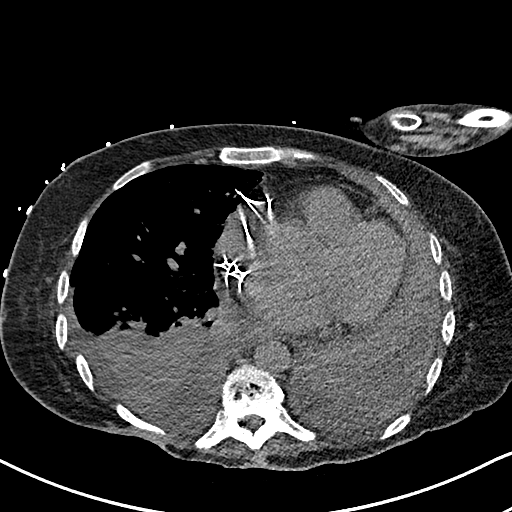
[im 52/144  lung]
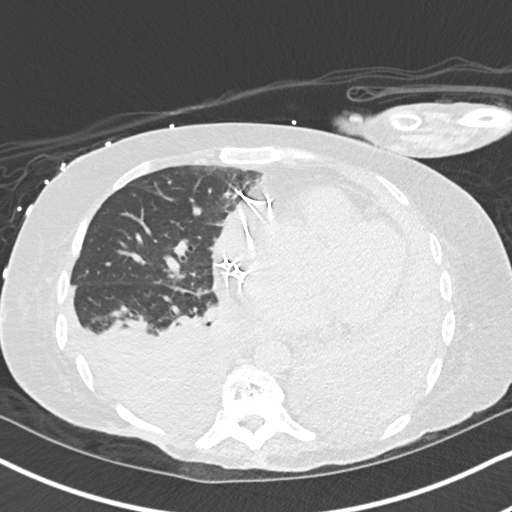
[im 62/144  lung]
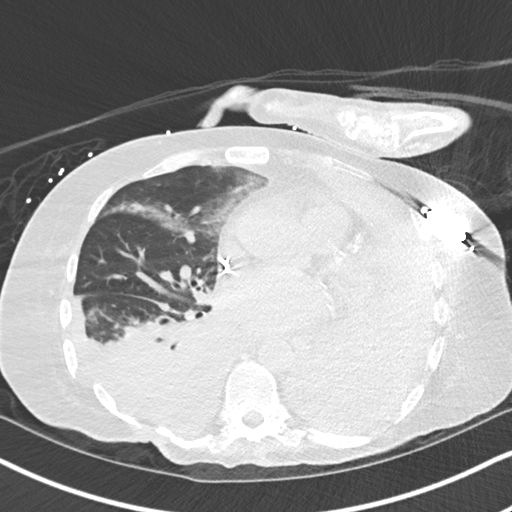
[im 82/144  lung]
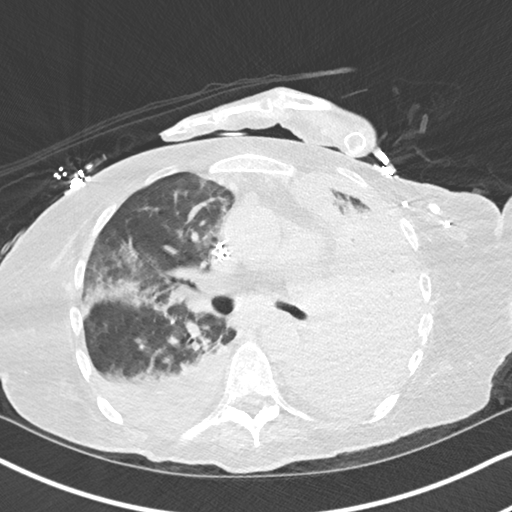
[im 92/144  lung]
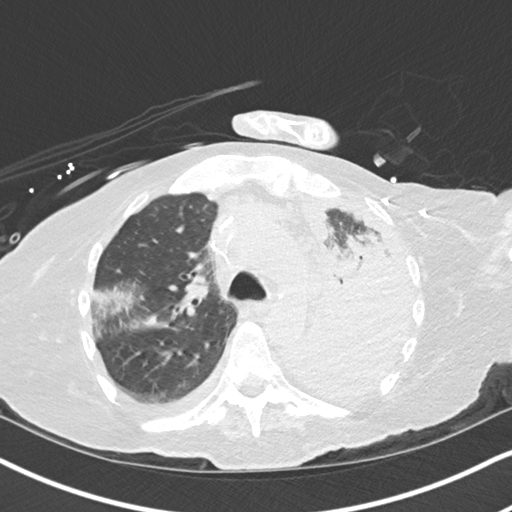
[im 103/144  mediastinal]
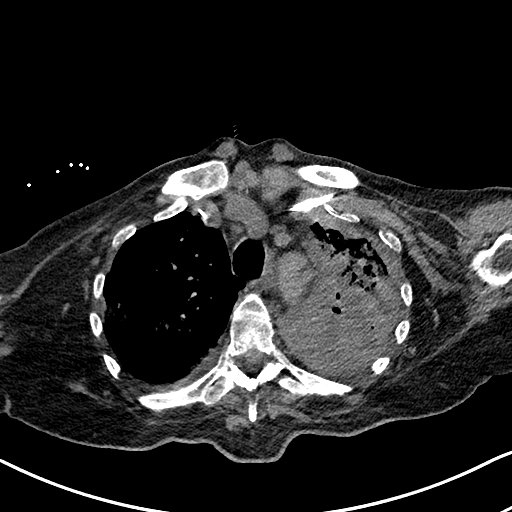
[im 103/144  lung]
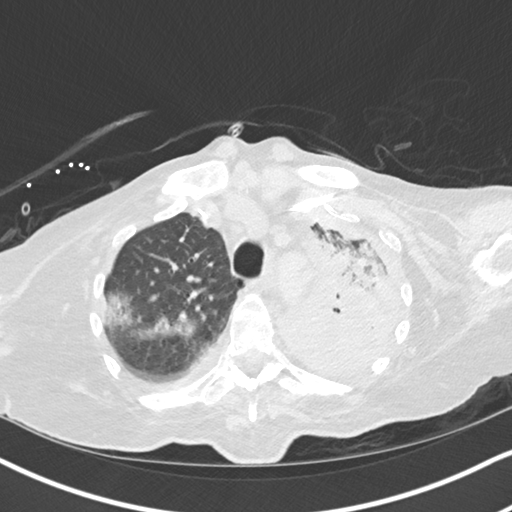
[im 113/144  lung]
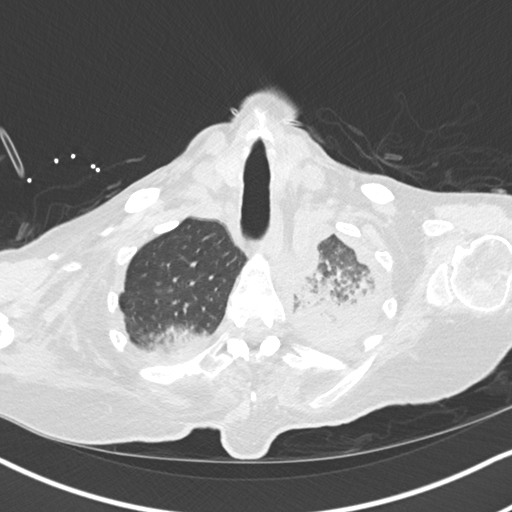
[im 123/144  lung]
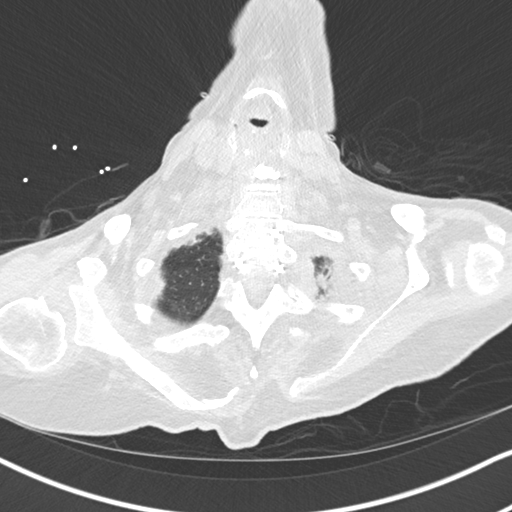
[im 133/144  lung]
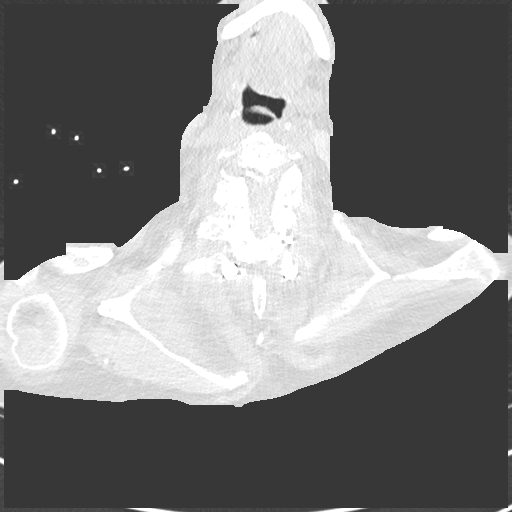

[Series 5: coronal · coronal · 0.60mm/px · 3 of 130 slices shown]
[im 26/130  lung]
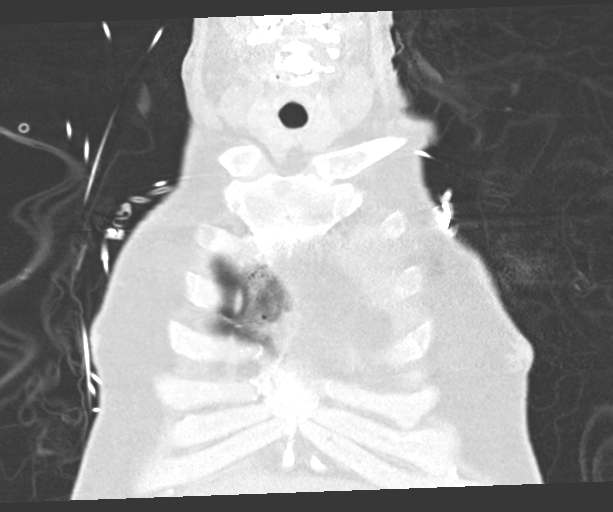
[im 52/130  lung]
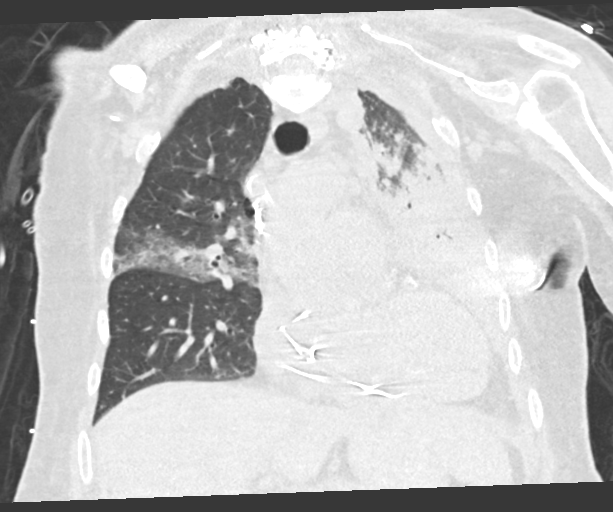
[im 78/130  lung]
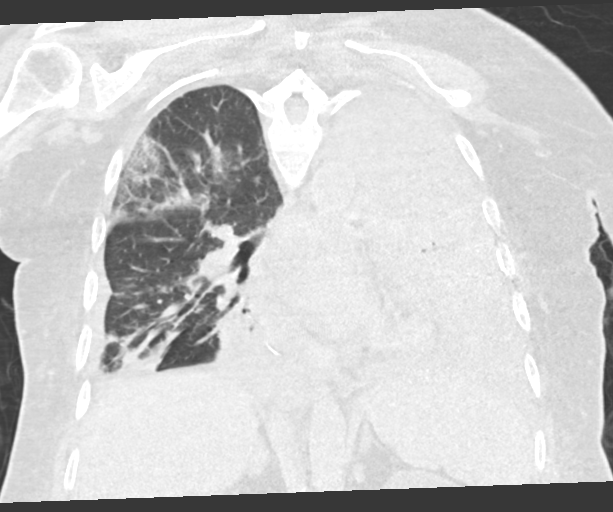

[15 of 36 positions shown; findings below may reference images not displayed]

FINDINGS: Cardiovascular: Pacer wires in RIGHT heart. No pericardial effusion.
Coronary calcification

Mediastinum/Nodes: No axillary or supraclavicular adenopathy.
Enlarged mediastinal lymph nodes are difficult define on noncontrast
exam similar prior.

Lungs/Pleura: Near complete opacification of the LEFT lung with
consolidative airspace disease. Small LEFT effusion. The LEFT lung
consolidation is increased from radiograph 2 days prior.

Evaluation airways demonstrates obstruction of the distal LEFT
mainstem bronchus.

RIGHT lung has mild airspace disease in the RIGHT middle lobe. There
is a moderate size RIGHT effusion with basilar atelectasis.

Upper Abdomen: Limited view of the liver, kidneys, pancreas are
unremarkable. Normal adrenal glands.

Musculoskeletal: No aggressive osseous lesion
IMPRESSION: 1. Near complete opacification of the LEFT lung. Increased
opacification compared to radiograph 2 days prior. There is
obstruction of the LEFT mainstem bronchus distally. Differential
would include mucous plugging versus increased pneumonia versus a
combination of both.
2. Bilateral pleural effusions.

These results will be called to the ordering clinician or
representative by the Radiologist Assistant, and communication
documented in the PACS or zVision Dashboard.
